# Patient Record
Sex: Male | Born: 1960 | Race: Black or African American | Hispanic: No | Marital: Married | Smoking: Never smoker
Health system: Southern US, Community
[De-identification: ages and names within clinical notes are randomized; demographics above are authoritative.]

## PROBLEM LIST (undated history)

## (undated) DIAGNOSIS — I10 Essential (primary) hypertension: Secondary | ICD-10-CM

## (undated) DIAGNOSIS — E7800 Pure hypercholesterolemia, unspecified: Secondary | ICD-10-CM

## (undated) DIAGNOSIS — E119 Type 2 diabetes mellitus without complications: Secondary | ICD-10-CM

## (undated) HISTORY — DX: Essential (primary) hypertension: I10

## (undated) HISTORY — PX: SHOULDER ARTHROTOMY: SHX1050

## (undated) HISTORY — DX: Pure hypercholesterolemia, unspecified: E78.00

## (undated) HISTORY — DX: Type 2 diabetes mellitus without complications: E11.9

---

## 2005-03-01 ENCOUNTER — Emergency Department (HOSPITAL_COMMUNITY): Admission: EM | Admit: 2005-03-01 | Discharge: 2005-03-01 | Payer: Self-pay | Admitting: Emergency Medicine

## 2005-11-03 ENCOUNTER — Emergency Department (HOSPITAL_COMMUNITY): Admission: EM | Admit: 2005-11-03 | Discharge: 2005-11-03 | Payer: Self-pay | Admitting: Emergency Medicine

## 2007-04-07 ENCOUNTER — Emergency Department (HOSPITAL_COMMUNITY): Admission: EM | Admit: 2007-04-07 | Discharge: 2007-04-08 | Payer: Self-pay | Admitting: Emergency Medicine

## 2009-10-04 ENCOUNTER — Emergency Department (HOSPITAL_COMMUNITY): Admission: EM | Admit: 2009-10-04 | Discharge: 2009-10-04 | Payer: Self-pay | Admitting: Emergency Medicine

## 2010-07-28 ENCOUNTER — Emergency Department (HOSPITAL_COMMUNITY)
Admission: EM | Admit: 2010-07-28 | Discharge: 2010-07-28 | Payer: Self-pay | Source: Home / Self Care | Admitting: Emergency Medicine

## 2011-05-03 ENCOUNTER — Emergency Department (HOSPITAL_COMMUNITY)
Admission: EM | Admit: 2011-05-03 | Discharge: 2011-05-03 | Disposition: A | Discharge status: Home / Self Care | Payer: 59 | Attending: Emergency Medicine | Admitting: Emergency Medicine

## 2011-05-03 DIAGNOSIS — K0889 Other specified disorders of teeth and supporting structures: Secondary | ICD-10-CM

## 2011-05-03 DIAGNOSIS — K089 Disorder of teeth and supporting structures, unspecified: Secondary | ICD-10-CM | POA: Insufficient documentation

## 2011-05-03 DIAGNOSIS — N390 Urinary tract infection, site not specified: Secondary | ICD-10-CM | POA: Insufficient documentation

## 2011-05-03 LAB — URINE MICROSCOPIC-ADD ON

## 2011-05-03 LAB — URINALYSIS, ROUTINE W REFLEX MICROSCOPIC
Ketones, ur: NEGATIVE mg/dL
Leukocytes, UA: NEGATIVE
Nitrite: NEGATIVE
Protein, ur: NEGATIVE mg/dL
pH: 6 (ref 5.0–8.0)

## 2011-05-03 MED ORDER — HYDROCODONE-ACETAMINOPHEN 5-325 MG PO TABS
1.0000 | ORAL_TABLET | Freq: Once | ORAL | Status: AC
  Administered 2011-05-03: 1 via ORAL
  Filled 2011-05-03: qty 1

## 2011-05-03 MED ORDER — CEPHALEXIN 500 MG PO CAPS
500.0000 mg | ORAL_CAPSULE | Freq: Once | ORAL | Status: AC
  Administered 2011-05-03: 500 mg via ORAL
  Filled 2011-05-03: qty 1

## 2011-05-03 MED ORDER — CEPHALEXIN 500 MG PO CAPS: 500.0000 mg | ORAL_CAPSULE | Freq: Four times a day (QID) | ORAL | Status: AC

## 2011-05-03 MED ORDER — HYDROCODONE-ACETAMINOPHEN 5-325 MG PO TABS: ORAL_TABLET | ORAL | Status: AC

## 2011-05-03 NOTE — ED Notes (Signed)
Also wants to be checked for right lower toothache.

## 2011-05-03 NOTE — ED Notes (Signed)
Flu like symptoms since friday

## 2011-05-03 NOTE — ED Provider Notes (Signed)
History     CSN: [id number] Arrival date & time: 05/03/2011  9:06 PM   First MD Initiated Contact with Patient 05/03/11 2100      Chief Complaint  Patient presents with  . Influenza    flu like s/s since friday  . Dental Pain    (Consider location/radiation/quality/duration/timing/severity/associated sxs/prior treatment) HPI Comments: Patient c/o urinary frequency , back pain, fever and chills for two days.  Patient is concerned he may have the "flu".  Denies cough, congestion, chest pain or Shortness of breath.  He also c/o painful right lower tooth.    Patient is a 50 y.o. male presenting with dysuria. The history is provided by the patient.  Dysuria  This is a new problem. The current episode started 2 days ago. The problem occurs every urination. The problem has not changed since onset.The quality of the pain is described as burning. The pain is at a severity of 6/10. The pain is mild. The maximum temperature recorded prior to his arrival was 100 to 100.9 F. He is sexually active. There is no history of pyelonephritis. Associated symptoms include chills and frequency. Pertinent negatives include no nausea, no vomiting, no discharge, no hematuria, no hesitancy, no urgency and no flank pain. Associated symptoms comments: Body aches. He has tried nothing for the symptoms. His past medical history does not include kidney stones, single kidney, urological procedure or recurrent UTIs.    History reviewed. No pertinent past medical history.  History reviewed. No pertinent past surgical history.  History reviewed. No pertinent family history.  History  Substance Use Topics  . Smoking status: Never Smoker   . Smokeless tobacco: Not on file  . Alcohol Use: No      Review of Systems  Constitutional: Positive for fever and chills. Negative for fatigue.  HENT: Positive for dental problem. Negative for ear pain, congestion, sore throat, facial swelling, rhinorrhea, trouble swallowing,  neck pain, neck stiffness and sinus pressure.   Respiratory: Negative for cough, shortness of breath and wheezing.   Cardiovascular: Negative for chest pain and palpitations.  Gastrointestinal: Negative for nausea, vomiting, abdominal pain, diarrhea and blood in stool.  Genitourinary: Positive for dysuria and frequency. Negative for hesitancy, urgency, hematuria, flank pain, decreased urine volume, discharge, penile swelling, scrotal swelling, genital sores, penile pain and testicular pain.  Musculoskeletal: Positive for myalgias and back pain. Negative for arthralgias.  Skin: Negative.  Negative for rash.  Neurological: Negative for dizziness, weakness and numbness.  Hematological: Negative for adenopathy. Does not bruise/bleed easily.  All other systems reviewed and are negative.    Allergies  Review of patient's allergies indicates not on file.  Home Medications  No current outpatient prescriptions on file.  BP 139/77  Pulse 105  Temp(Src) 99 F (37.2 C) (Oral)  Resp 16  Ht 5\' 8"  (1.727 m)  Wt 222 lb (100.699 kg)  BMI 33.76 kg/m2  SpO2 100%  Physical Exam  Nursing note and vitals reviewed. Constitutional: He is oriented to person, place, and time. He appears well-developed and well-nourished. No distress.  HENT:  Head: Normocephalic and atraumatic.  Mouth/Throat: Oropharynx is clear and moist.  Neck: Normal range of motion. Neck supple.  Cardiovascular: Normal rate, regular rhythm and normal heart sounds.   Pulmonary/Chest: Effort normal and breath sounds normal. No respiratory distress. He exhibits no tenderness.  Abdominal: Soft. He exhibits no distension and no mass. There is no tenderness. There is no rebound and no guarding.  Musculoskeletal: Normal range of motion. He  exhibits tenderness.       Lumbar back: He exhibits tenderness. He exhibits normal range of motion, no bony tenderness, no swelling, no spasm and normal pulse.       Diffuse ttp lower lumbar paraspinal  muscles.  No focal neuro deficits , DTR's nml  Lymphadenopathy:    He has no cervical adenopathy.  Neurological: He is alert and oriented to person, place, and time. He has normal reflexes. No cranial nerve deficit. He exhibits normal muscle tone. Coordination normal.  Skin: Skin is warm and dry.    ED Course  Procedures (including critical care time)  Results for orders placed during the hospital encounter of 05/03/11  URINALYSIS, ROUTINE W REFLEX MICROSCOPIC      Component Value Range   Color, Urine YELLOW  YELLOW    Appearance CLEAR  CLEAR    Specific Gravity, Urine 1.020  1.005 - 1.030    pH 6.0  5.0 - 8.0    Glucose, UA NEGATIVE  NEGATIVE (mg/dL)   Hgb urine dipstick LARGE (*) NEGATIVE    Bilirubin Urine NEGATIVE  NEGATIVE    Ketones, ur NEGATIVE  NEGATIVE (mg/dL)   Protein, ur NEGATIVE  NEGATIVE (mg/dL)   Urobilinogen, UA 0.2  0.0 - 1.0 (mg/dL)   Nitrite NEGATIVE  NEGATIVE    Leukocytes, UA NEGATIVE  NEGATIVE   URINE MICROSCOPIC-ADD ON      Component Value Range   WBC, UA 3-6  <3 (WBC/hpf)   RBC / HPF 7-10  <3 (RBC/hpf)   Bacteria, UA RARE  RARE          MDM    9:49 PM urine culture pending.  Patient is alert, non-toxic appearing.  Vitals stable.  No CVA tenderness, significant fever or vomiting to suggest pyleo.     OUTPATIENT MEDICATIONS PRESCRIBED FROM THE ED:   New Prescriptions   CEPHALEXIN (KEFLEX) 500 MG CAPSULE    Take 1 capsule (500 mg total) by mouth 4 (four) times daily. For 7 days   HYDROCODONE-ACETAMINOPHEN (NORCO) 5-325 MG PER TABLET    Take one or two tabs po q 4-6 hrs prn pain      The patient appears reasonably screened and/or stabilized for discharge and I doubt any other medical condition or other [facility] requiring further screening, evaluation, or treatment in the ED at this time prior to discharge.    Adrijana Haros L. Nieves Barberi, PA 05/08/11 1400

## 2011-05-05 LAB — URINE CULTURE
Colony Count: NO GROWTH
Culture: NO GROWTH

## 2011-05-12 NOTE — ED Provider Notes (Signed)
Medical screening examination/treatment/procedure(s) were performed by non-physician practitioner and as supervising physician I was immediately available for consultation/collaboration.  Donnetta Hutching, MD 05/12/11 [phone]

## 2011-12-10 ENCOUNTER — Emergency Department (HOSPITAL_COMMUNITY)
Admission: EM | Admit: 2011-12-10 | Discharge: 2011-12-11 | Disposition: A | Discharge status: Home / Self Care | Payer: BC Managed Care – PPO | Attending: Emergency Medicine | Admitting: Emergency Medicine

## 2011-12-10 ENCOUNTER — Encounter (HOSPITAL_COMMUNITY): Payer: Self-pay | Admitting: *Deleted

## 2011-12-10 DIAGNOSIS — S335XXA Sprain of ligaments of lumbar spine, initial encounter: Secondary | ICD-10-CM | POA: Insufficient documentation

## 2011-12-10 DIAGNOSIS — X500XXA Overexertion from strenuous movement or load, initial encounter: Secondary | ICD-10-CM | POA: Insufficient documentation

## 2011-12-10 DIAGNOSIS — S39012A Strain of muscle, fascia and tendon of lower back, initial encounter: Secondary | ICD-10-CM

## 2011-12-10 NOTE — ED Notes (Signed)
Lower back pain started yesterday, denies injury

## 2011-12-11 MED ORDER — PREDNISONE 50 MG PO TABS: ORAL_TABLET | ORAL | Status: AC

## 2011-12-11 MED ORDER — CYCLOBENZAPRINE HCL 10 MG PO TABS
10.0000 mg | ORAL_TABLET | Freq: Once | ORAL | Status: AC
  Administered 2011-12-11: 10 mg via ORAL
  Filled 2011-12-11: qty 1

## 2011-12-11 MED ORDER — PREDNISONE 20 MG PO TABS
60.0000 mg | ORAL_TABLET | Freq: Once | ORAL | Status: AC
  Administered 2011-12-11: 60 mg via ORAL
  Filled 2011-12-11: qty 3

## 2011-12-11 MED ORDER — CYCLOBENZAPRINE HCL 10 MG PO TABS: ORAL_TABLET | ORAL | Status: DC

## 2011-12-11 NOTE — ED Notes (Signed)
Discharge instructions reviewed with pt, questions answered. Pt verbalized understanding.  

## 2011-12-11 NOTE — ED Provider Notes (Signed)
Medical screening examination/treatment/procedure(s) were performed by non-physician practitioner and as supervising physician I was immediately available for consultation/collaboration.   Latronda Spink L Jethro Radke, MD 12/11/11 0339 

## 2011-12-11 NOTE — Discharge Instructions (Signed)
Back Pain, Adult Low back pain is very common. About 1 in 5 people have back pain.The cause of low back pain is rarely dangerous. The pain often gets better over time.About half of people with a sudden onset of back pain feel better in just 2 weeks. About 8 in 10 people feel better by 6 weeks.  CAUSES Some common causes of back pain include:  Strain of the muscles or ligaments supporting the spine.   Wear and tear (degeneration) of the spinal discs.   Arthritis.   Direct injury to the back.  DIAGNOSIS Most of the time, the direct cause of low back pain is not known.However, back pain can be treated effectively even when the exact cause of the pain is unknown.Answering your caregiver's questions about your overall health and symptoms is one of the most accurate ways to make sure the cause of your pain is not dangerous. If your caregiver needs more information, he or she may order lab work or imaging tests (X-rays or MRIs).However, even if imaging tests show changes in your back, this usually does not require surgery. HOME CARE INSTRUCTIONS For many people, back pain returns.Since low back pain is rarely dangerous, it is often a condition that people can learn to manageon their own.   Remain active. It is stressful on the back to sit or stand in one place. Do not sit, drive, or stand in one place for more than 30 minutes at a time. Take short walks on level surfaces as soon as pain allows.Try to increase the length of time you walk each day.   Do not stay in bed.Resting more than 1 or 2 days can delay your recovery.   Do not avoid exercise or work.Your body is made to move.It is not dangerous to be active, even though your back may hurt.Your back will likely heal faster if you return to being active before your pain is gone.   Pay attention to your body when you bend and lift. Many people have less discomfortwhen lifting if they bend their knees, keep the load close to their  bodies,and avoid twisting. Often, the most comfortable positions are those that put less stress on your recovering back.   Find a comfortable position to sleep. Use a firm mattress and lie on your side with your knees slightly bent. If you lie on your back, put a pillow under your knees.   Only take over-the-counter or prescription medicines as directed by your caregiver. Over-the-counter medicines to reduce pain and inflammation are often the most helpful.Your caregiver may prescribe muscle relaxant drugs.These medicines help dull your pain so you can more quickly return to your normal activities and healthy exercise.   Put ice on the injured area.   Put ice in a plastic bag.   Place a towel between your skin and the bag.   Leave the ice on for 15 to 20 minutes, 3 to 4 times a day for the first 2 to 3 days. After that, ice and heat may be alternated to reduce pain and spasms.   Ask your caregiver about trying back exercises and gentle massage. This may be of some benefit.   Avoid feeling anxious or stressed.Stress increases muscle tension and can worsen back pain.It is important to recognize when you are anxious or stressed and learn ways to manage it.Exercise is a great option.  SEEK MEDICAL CARE IF:  You have pain that is not relieved with rest or medicine.   You have   pain that does not improve in 1 week.   You have new symptoms.   You are generally not feeling well.  SEEK IMMEDIATE MEDICAL CARE IF:   You have pain that radiates from your back into your legs.   You develop new bowel or bladder control problems.   You have unusual weakness or numbness in your arms or legs.   You develop nausea or vomiting.   You develop abdominal pain.   You feel faint.  Document Released: 06/29/2005 Document Revised: 06/18/2011 Document Reviewed: 11/17/2010 [facility] Patient Information 2012 [place], [place].   Take the meds as directed.  Apply ice several times daily.  Follow up  with dr. Hilda Lias as needed.

## 2011-12-11 NOTE — ED Provider Notes (Signed)
History     CSN: [id number]  Arrival date & time 12/10/11  2200   First MD Initiated Contact with Patient 12/10/11 2335      Chief Complaint  Patient presents with  . 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  Back was sore yest but much worse upon awakening this AM.  No radiation.  Patient is a 51 y.o. male presenting with back pain. The history is provided by the patient. No language interpreter was used.  Back Pain  This is a new problem. The current episode started yesterday. The problem occurs constantly. The problem has been gradually worsening. The pain is associated with lifting heavy objects. The pain is present in the lumbar spine. The quality of the pain is described as aching. The pain does not radiate. The pain is severe. The symptoms are aggravated by bending, certain positions and twisting. The pain is the same all the time. Pertinent negatives include no fever, no numbness, no dysuria and no weakness. Treatments tried: aspirin. The treatment provided no (hurt stomach) relief.    History reviewed. No pertinent past medical history.  History reviewed. No pertinent past surgical history.  History reviewed. No pertinent family history.  History  Substance Use Topics  . Smoking status: Never Smoker   . Smokeless tobacco: Not on file  . Alcohol Use: No      Review of Systems  Constitutional: Negative for fever.  Genitourinary: Negative for dysuria, urgency, frequency, hematuria and flank pain.  Musculoskeletal: Positive for back pain.  Neurological: Negative for weakness and numbness.  All other systems reviewed and are negative.    Allergies  Review of patient's allergies indicates no known allergies.  Home Medications   Current Outpatient Rx  Name Route Sig Dispense Refill    . CYCLOBENZAPRINE HCL 10 MG PO TABS  1/2 to one tab po TID prn low back pain 20 tablet 0  . PREDNISONE 50 MG PO TABS  One tab po QD 6 tablet 0    BP 142/88  Pulse 72  Temp(Src) 98.2 F (36.8 C) (Oral)  Resp 16  Ht 5\' 8"  (1.727 m)  Wt 220 lb (99.791 kg)  BMI 33.45 kg/m2  SpO2 99%  Physical Exam  Nursing note and vitals reviewed. Constitutional: He is oriented to person, place, and time. He appears well-developed and well-nourished.  HENT:  Head: Normocephalic and atraumatic.  Eyes: EOM are normal.  Neck: Normal range of motion.  Cardiovascular: Normal rate, regular rhythm, normal heart sounds and intact distal pulses.   Pulmonary/Chest: Effort normal and breath sounds normal. No respiratory distress.  Abdominal: Soft. He exhibits no distension. There is no tenderness.  Musculoskeletal: He exhibits tenderness.       Arms: Neurological: He is alert and oriented to person, place, and time. He has normal strength. No sensory deficit. Gait abnormal. Coordination normal. GCS eye subscore is 4. GCS verbal subscore is 5. GCS motor subscore is 6.  Reflex Scores:      Patellar reflexes are 2+ on the right side and 2+ on the left side.      Achilles reflexes are 2+ on the right side and 2+ on the left side.      Pain with movement and walking.  Skin: Skin is warm and dry.  Psychiatric: He has a normal mood and affect. Judgment normal.  ED Course  Procedures (including critical care time)  Labs Reviewed - No data to display No results found.   1. Lumbar strain       MDM  No saddle dysthesia.   rx- prednisone 50 mg , 6 rx-flexeril 10 mg, TID, 21. [address] [place], [place] 12/11/11 [phone]

## 2012-10-11 ENCOUNTER — Telehealth: Payer: Self-pay

## 2012-10-11 ENCOUNTER — Other Ambulatory Visit: Payer: Self-pay

## 2012-10-11 DIAGNOSIS — Z1211 Encounter for screening for malignant neoplasm of colon: Secondary | ICD-10-CM

## 2012-10-12 NOTE — Telephone Encounter (Signed)
   Gastroenterology Pre-Procedure Form   Request Date: 10/11/2012     Requesting Physician: Dr. Phillips Odor     PATIENT INFORMATION:  Thomas Johnson is a 52 y.o., male (DOB=[birth year]).  PROCEDURE: Procedure(s) requested: colonoscopy Procedure Reason: screening for colon cancer  PATIENT REVIEW QUESTIONS: The patient reports the following:   1. Diabetes Melitis: no 2. Joint replacements in the past 12 months: no 3. Major health problems in the past 3 months: no 4. Has an artificial valve or MVP:no 5. Has been advised in past to take antibiotics in advance of a procedure like teeth cleaning: no}    MEDICATIONS & ALLERGIES:    Patient reports the following regarding taking any blood thinners:   Plavix? no Aspirin?no Coumadin?  no  Patient confirms/reports the following medications:  Current Outpatient Prescriptions  Medication Sig Dispense Refill  . terbinafine (LAMISIL) 250 MG tablet Take 250 mg by mouth daily.       No current facility-administered medications for this visit.    Patient confirms/reports the following allergies:  No Known Allergies  Patient is appropriate to schedule for requested procedure(s): yes  AUTHORIZATION INFORMATION Primary Insurance:   ID #:   Group #:  Pre-Cert / Auth required: Pre-Cert / Auth #:   Secondary Insurance:   ID #:   Group #:  Pre-Cert / Auth required:  Pre-Cert / Auth #:   No orders of the defined types were placed in this encounter.    SCHEDULE INFORMATION: Procedure has been scheduled as follows:  Date: 11/04/2012    Time: 9:30 AM  Location: [facility] Short Stay  This Gastroenterology Pre-Precedure Form is being routed to the following provider(s) for review: R. Roetta Sessions, MD

## 2012-10-12 NOTE — Telephone Encounter (Signed)
Ok to schedule.

## 2012-10-13 MED ORDER — PEG-KCL-NACL-NASULF-NA ASC-C 100 G PO SOLR: 1.0000 | ORAL | Status: DC

## 2012-10-13 NOTE — Telephone Encounter (Signed)
Rx sent to [facility] in [place]. Instructions mailed to pt.

## 2012-11-04 ENCOUNTER — Ambulatory Visit (HOSPITAL_COMMUNITY)
Admission: RE | Admit: 2012-11-04 | Discharge: 2012-11-04 | Disposition: A | Discharge status: Home / Self Care | Payer: BC Managed Care – PPO | Source: Ambulatory Visit | Attending: Internal Medicine | Admitting: Internal Medicine

## 2012-11-04 ENCOUNTER — Encounter (HOSPITAL_COMMUNITY)
Admission: RE | Disposition: A | Discharge status: Home / Self Care | Payer: Self-pay | Source: Ambulatory Visit | Attending: Internal Medicine

## 2012-11-04 ENCOUNTER — Encounter (HOSPITAL_COMMUNITY): Payer: Self-pay | Admitting: *Deleted

## 2012-11-04 DIAGNOSIS — Z1211 Encounter for screening for malignant neoplasm of colon: Secondary | ICD-10-CM | POA: Insufficient documentation

## 2012-11-04 HISTORY — PX: COLONOSCOPY: SHX5424

## 2012-11-04 SURGERY — COLONOSCOPY
Anesthesia: Moderate Sedation

## 2012-11-04 MED ORDER — MIDAZOLAM HCL 5 MG/5ML IJ SOLN
INTRAMUSCULAR | Status: DC | PRN
  Administered 2012-11-04 (×2): 2 mg via INTRAVENOUS

## 2012-11-04 MED ORDER — MIDAZOLAM HCL 5 MG/5ML IJ SOLN
INTRAMUSCULAR | Status: AC
  Filled 2012-11-04: qty 10

## 2012-11-04 MED ORDER — ONDANSETRON HCL 4 MG/2ML IJ SOLN
INTRAMUSCULAR | Status: AC
  Filled 2012-11-04: qty 2

## 2012-11-04 MED ORDER — MEPERIDINE HCL 100 MG/ML IJ SOLN
INTRAMUSCULAR | Status: AC
  Filled 2012-11-04: qty 2

## 2012-11-04 MED ORDER — ONDANSETRON HCL 4 MG/2ML IJ SOLN
INTRAMUSCULAR | Status: DC | PRN
  Administered 2012-11-04: 4 mg via INTRAVENOUS

## 2012-11-04 MED ORDER — STERILE WATER FOR IRRIGATION IR SOLN
Status: DC | PRN
  Administered 2012-11-04: 09:00:00

## 2012-11-04 MED ORDER — MEPERIDINE HCL 100 MG/ML IJ SOLN
INTRAMUSCULAR | Status: DC | PRN
  Administered 2012-11-04 (×2): 50 mg via INTRAVENOUS

## 2012-11-04 MED ORDER — SODIUM CHLORIDE 0.9 % IV SOLN
INTRAVENOUS | Status: DC
  Administered 2012-11-04: 1000 mL via INTRAVENOUS

## 2012-11-04 NOTE — Op Note (Signed)
[facility] [address] [place], [address]   COLONOSCOPY PROCEDURE REPORT  PATIENT: Thomas Johnson, Thomas Johnson  MR#:         [id number] BIRTHDATE: [birth year] , 52  yrs. old GENDER: Male ENDOSCOPIST: R.  Roetta Sessions, MD [facility] REFERRED BY:  Lenise Herald, PA-C PROCEDURE DATE:  11/04/2012 PROCEDURE:     Screening colonoscopy  INDICATIONS: First-ever average risk screening examination  INFORMED CONSENT:  The risks, benefits, alternatives and imponderables including but not limited to bleeding, perforation as well as the possibility of a missed lesion have been reviewed.  The potential for biopsy, lesion removal, etc. have also been discussed.  Questions have been answered.  All parties agreeable. Please see the history and physical in the medical record for more information.  MEDICATIONS: Versed 4 mg IV and Demerol 100 mg IV in divided doses. Zofran 4 mg  DESCRIPTION OF PROCEDURE:  After a digital rectal exam was performed, the EC-3890Li ([id number])  colonoscope was advanced from the anus through the rectum and colon to the area of the cecum, ileocecal valve and appendiceal orifice.  The cecum was deeply intubated.  These structures were well-seen and photographed for the record.  From the level of the cecum and ileocecal valve, the scope was slowly and cautiously withdrawn.  The mucosal surfaces were carefully surveyed utilizing scope tip deflection to facilitate fold flattening as needed.  The scope was pulled down into the rectum where a thorough examination including retroflexion was performed.    FINDINGS:  Adequate preparation. The rectal and colonic mucosa appeared normal.   THERAPEUTIC / DIAGNOSTIC MANEUVERS PERFORMED:  None  COMPLICATIONS: none  CECAL WITHDRAWAL TIME:  8 minutes  IMPRESSION:  Normal colonoscopy  RECOMMENDATIONS: Repeat screening examination in 10 years   _______________________________ eSigned:  R. Roetta Sessions, MD FACP  [facility] 11/04/2012 9:39 AM   CC:

## 2012-11-04 NOTE — H&P (Signed)
  Primary Care Physician:  Lenise Herald, PA-C Primary Gastroenterologist:  Dr. Jena Gauss  Pre-Procedure History & Physical: HPI:  Thomas Johnson is a 52 y.o. male is here for a screening colonoscopy. No bowel symptoms. No family history colon polyps or colon cancer. No prior colonoscopy.  History reviewed. No pertinent past medical history.  Past Surgical History  Procedure Laterality Date  . Shoulder arthrotomy      removal of cyst    Prior to Admission medications   Medication Sig Start Date End Date Taking? Authorizing Provider  terbinafine (LAMISIL) 250 MG tablet Take 250 mg by mouth daily.   Yes Historical Provider, MD  peg 3350 powder (MOVIPREP) 100 G SOLR Take 1 kit (100 g total) by mouth as directed. 10/13/12   Corbin Ade, MD    Allergies as of 10/11/2012  . (No Known Allergies)    History reviewed. No pertinent family history.  History   Social History  . Marital Status: Married    Spouse Name: N/A    Number of Children: N/A  . Years of Education: N/A   Occupational History  . Not on file.   Social History Main Topics  . Smoking status: Never Smoker   . Smokeless tobacco: Not on file  . Alcohol Use: No  . Drug Use: No  . Sexually Active: Yes    Birth Control/ Protection: None   Other Topics Concern  . Not on file   Social History Narrative  . No narrative on file    Review of Systems: See HPI, otherwise negative ROS  Physical Exam: BP 158/83  Pulse 82  Temp(Src) 98.7 F (37.1 C) (Oral)  Resp 27  SpO2 93% General:   Alert,  Well-developed, well-nourished, pleasant and cooperative in NAD Head:  Normocephalic and atraumatic. Eyes:  Sclera clear, no icterus.   Conjunctiva pink. Ears:  Normal auditory acuity. Nose:  No deformity, discharge,  or lesions. Mouth:  No deformity or lesions, dentition normal. Neck:  Supple; no masses or thyromegaly. Lungs:  Clear throughout to auscultation.   No wheezes, crackles, or rhonchi. No acute  distress. Heart:  Regular rate and rhythm; no murmurs, clicks, rubs,  or gallops. Abdomen:  Soft, nontender and nondistended. No masses, hepatosplenomegaly or hernias noted. Normal bowel sounds, without guarding, and without rebound.   Msk:  Symmetrical without gross deformities. Normal posture. Pulses:  Normal pulses noted. Extremities:  Without clubbing or edema. Neurologic:  Alert and  oriented x4;  grossly normal neurologically. Skin:  Intact without significant lesions or rashes. Cervical Nodes:  No significant cervical adenopathy. Psych:  Alert and cooperative. Normal mood and affect.  Impression/Plan: Thomas Johnson is now here to undergo a screening colonoscopy.  First-ever average risk screening examination to  Risks, benefits, limitations, imponderables and alternatives regarding colonoscopy have been reviewed with the patient. Questions have been answered. All parties agreeable.

## 2012-11-08 ENCOUNTER — Encounter (HOSPITAL_COMMUNITY): Payer: Self-pay | Admitting: Internal Medicine

## 2013-01-31 ENCOUNTER — Encounter (HOSPITAL_COMMUNITY): Payer: Self-pay | Admitting: Emergency Medicine

## 2013-01-31 ENCOUNTER — Emergency Department (HOSPITAL_COMMUNITY): Payer: BC Managed Care – PPO

## 2013-01-31 ENCOUNTER — Emergency Department (HOSPITAL_COMMUNITY)
Admission: EM | Admit: 2013-01-31 | Discharge: 2013-02-01 | Disposition: A | Discharge status: Home / Self Care | Payer: BC Managed Care – PPO | Attending: Emergency Medicine | Admitting: Emergency Medicine

## 2013-01-31 DIAGNOSIS — M545 Low back pain, unspecified: Secondary | ICD-10-CM | POA: Insufficient documentation

## 2013-01-31 DIAGNOSIS — M549 Dorsalgia, unspecified: Secondary | ICD-10-CM

## 2013-01-31 DIAGNOSIS — Z791 Long term (current) use of non-steroidal anti-inflammatories (NSAID): Secondary | ICD-10-CM | POA: Insufficient documentation

## 2013-01-31 DIAGNOSIS — M25511 Pain in right shoulder: Secondary | ICD-10-CM

## 2013-01-31 DIAGNOSIS — Z79899 Other long term (current) drug therapy: Secondary | ICD-10-CM | POA: Insufficient documentation

## 2013-01-31 DIAGNOSIS — M2550 Pain in unspecified joint: Secondary | ICD-10-CM | POA: Insufficient documentation

## 2013-01-31 DIAGNOSIS — M25519 Pain in unspecified shoulder: Secondary | ICD-10-CM | POA: Insufficient documentation

## 2013-01-31 MED ORDER — CYCLOBENZAPRINE HCL 10 MG PO TABS
10.0000 mg | ORAL_TABLET | Freq: Once | ORAL | Status: AC
  Administered 2013-02-01: 10 mg via ORAL
  Filled 2013-01-31: qty 1

## 2013-01-31 MED ORDER — CYCLOBENZAPRINE HCL 10 MG PO TABS: 10.0000 mg | ORAL_TABLET | Freq: Three times a day (TID) | ORAL | Status: AC | PRN

## 2013-01-31 MED ORDER — NAPROXEN 500 MG PO TABS: 500.0000 mg | ORAL_TABLET | Freq: Two times a day (BID) | ORAL | Status: AC

## 2013-01-31 MED ORDER — HYDROCODONE-ACETAMINOPHEN 5-325 MG PO TABS: ORAL_TABLET | ORAL | Status: AC

## 2013-01-31 MED ORDER — OXYCODONE-ACETAMINOPHEN 5-325 MG PO TABS
1.0000 | ORAL_TABLET | Freq: Once | ORAL | Status: AC
  Administered 2013-02-01: 1 via ORAL
  Filled 2013-01-31: qty 1

## 2013-01-31 NOTE — ED Notes (Signed)
Patient complaining of lower right back pain that started 2 days ago. Also complaining of right shoulder pain that started today, denies injury to shoulder.

## 2013-01-31 NOTE — ED Notes (Signed)
Patient reports back hurts only when moving or changing positions. States shoulder pain is constant.

## 2013-02-03 NOTE — ED Provider Notes (Signed)
CSN: [id number]     Arrival date & time 01/31/13  2115 History     First MD Initiated Contact with Patient 01/31/13 2154     Chief Complaint  Patient presents with  . Back Pain  . Shoulder Pain   (Consider location/radiation/quality/duration/timing/severity/associated sxs/prior Treatment) Patient is a 52 y.o. male presenting with back pain and shoulder pain. The history is provided by the patient.  Back Pain Location:  Lumbar spine, gluteal region and sacro-iliac joint Quality:  Aching Radiates to:  R posterior upper leg Pain severity:  Moderate Pain is:  Same all the time Onset quality:  Gradual Timing:  Constant Progression:  Unchanged Chronicity:  Recurrent Context: not falling, not lifting heavy objects and not recent injury   Relieved by:  Nothing Worsened by:  Bending, twisting, standing and movement Ineffective treatments:  OTC medications Associated symptoms: leg pain   Associated symptoms: no abdominal pain, no abdominal swelling, no bladder incontinence, no bowel incontinence, no chest pain, no dysuria, no fever, no headaches, no numbness, no paresthesias, no pelvic pain, no perianal numbness, no tingling and no weakness   Shoulder Pain This is a new problem. The current episode started 1 to 4 weeks ago. The problem occurs intermittently. The problem has been unchanged. Associated symptoms include arthralgias. Pertinent negatives include no abdominal pain, chest pain, congestion, fever, headaches, joint swelling, neck pain, numbness, rash, sore throat, vomiting or weakness. The symptoms are aggravated by bending. He has tried acetaminophen for the symptoms. The treatment provided no relief.    History reviewed. No pertinent past medical history. Past Surgical History  Procedure Laterality Date  . Shoulder arthrotomy      removal of cyst  . Colonoscopy N/A 11/04/2012    Procedure: COLONOSCOPY;  Surgeon: Corbin Ade, MD;  Location: [facility];  Service: Endoscopy;   Laterality: N/A;  9:30 AM   History reviewed. No pertinent family history. History  Substance Use Topics  . Smoking status: Never Smoker   . Smokeless tobacco: Not on file  . Alcohol Use: No    Review of Systems  Constitutional: Negative for fever.  HENT: Negative for congestion, sore throat and neck pain.   Respiratory: Negative for shortness of breath.   Cardiovascular: Negative for chest pain.  Gastrointestinal: Negative for vomiting, abdominal pain, constipation and bowel incontinence.  Genitourinary: Negative for bladder incontinence, dysuria, hematuria, flank pain, decreased urine volume, difficulty urinating and pelvic pain.       No perineal numbness or incontinence of urine or feces  Musculoskeletal: Positive for back pain and arthralgias. Negative for joint swelling.  Skin: Negative for rash.  Neurological: Negative for tingling, weakness, numbness, headaches and paresthesias.  All other systems reviewed and are negative.    Allergies  Review of patient's allergies indicates no known allergies.  Home Medications   Current Outpatient Rx  Name  Route  Sig  Dispense  Refill  . acetaminophen (TYLENOL) 500 MG tablet   Oral   Take 500 mg by mouth daily as needed for pain.         Marland Kitchen BIOTIN PO   Oral   Take 1 tablet by mouth daily.         . Multiple Vitamin (MULTIVITAMIN WITH MINERALS) TABS   Oral   Take 1 tablet by mouth every morning.         . terbinafine (LAMISIL) 250 MG tablet   Oral   Take 250 mg by mouth daily.         Marland Kitchen  cyclobenzaprine (FLEXERIL) 10 MG tablet   Oral   Take 1 tablet (10 mg total) by mouth 3 (three) times daily as needed.   21 tablet   0   . HYDROcodone-acetaminophen (NORCO/VICODIN) 5-325 MG per tablet      Take one-two tabs po q 4-6 hrs prn pain   20 tablet   0   . naproxen (NAPROSYN) 500 MG tablet   Oral   Take 1 tablet (500 mg total) by mouth 2 (two) times daily with a meal.   20 tablet   0    BP 135/81  Pulse 67   Temp(Src) 98.3 F (36.8 C) (Oral)  Resp 20  Ht 5\' 8"  (1.727 m)  Wt 218 lb (98.884 kg)  BMI 33.15 kg/m2  SpO2 98% Physical Exam  Nursing note and vitals reviewed. Constitutional: He is oriented to person, place, and time. He appears well-developed and well-nourished. No distress.  HENT:  Head: Normocephalic and atraumatic.  Neck: Normal range of motion. Neck supple.  Cardiovascular: Normal rate, regular rhythm, normal heart sounds and intact distal pulses.   No murmur heard. Pulmonary/Chest: Effort normal and breath sounds normal. No respiratory distress.  Musculoskeletal: He exhibits tenderness. He exhibits no edema.       Right shoulder: He exhibits pain. He exhibits normal range of motion, no bony tenderness, no swelling, no effusion, no crepitus, no deformity, no laceration, normal pulse and normal strength.       Lumbar back: He exhibits tenderness and pain. He exhibits normal range of motion, no swelling, no deformity, no laceration and normal pulse.  ttp of the right lumbar paraspinal muscles and right SI joint.  No spinal tenderness.  DP pulses are brisk and symmetrical.  Distal sensation intact.  Hip Flexors/Extensors are intact  Neurological: He is alert and oriented to person, place, and time. No cranial nerve deficit or sensory deficit. He exhibits normal muscle tone. Coordination and gait normal.  Reflex Scores:      Patellar reflexes are 2+ on the right side and 2+ on the left side.      Achilles reflexes are 2+ on the right side and 2+ on the left side. Skin: Skin is warm and dry.    ED Course   Procedures (including critical care time)  Labs Reviewed - No data to display No results found.  Dg Lumbar Spine Complete  01/31/2013   *RADIOLOGY REPORT*  Clinical Data: Back pain and right shoulder pain for 2 days.  No injury.  LUMBAR SPINE - COMPLETE 4+ VIEW  Comparison: None.  Findings: Five lumbar type vertebra.  Normal alignment of the lumbar vertebrae and facet  joints.  No vertebral compression deformities. Intervertebral disc space heights are preserved. Minimal endplate hypertrophic changes.  No focal bone lesion or bone destruction.  Bone cortex and trabecular architecture appear intact.  No focal bone lesion or bone destruction.  IMPRESSION: No acute bony abnormalities demonstrated in the lumbar spine.   Original Report Authenticated By: Burman Nieves, M.D.   Dg Shoulder Right  01/31/2013   *RADIOLOGY REPORT*  Clinical Data: Right shoulder pain  RIGHT SHOULDER - 2+ VIEW  Comparison: None.  Findings: No fracture or dislocation is noted.  Underlying ribs appear normal.  Degenerative change involving right acromioclavicular joint is noted.  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  No acute abnormality seen in right shoulder.   Original Report Authenticated By: Lupita Raider.,  M.D.    1. Back pain   2.  Shoulder pain, right     MDM  Previous ed chart reviewed.     Patient has ttp of the right lumbar paraspinal muscles.  No focal neuro deficits on exam.  Ambulates with a steady gait.   Right shoulder pain is intermittent, no deformity or edema, pain to shoulder reproduced with abduction.  Doubt emergent neurological or infectious process.  Pt agrees to rest, ice, heat and close f/u with his PMD, referral info also given.  Cadance Raus L. Kadan Millstein, PA-C 02/03/13 1404  Navraj Dreibelbis L. Trisha Mangle, PA-C 02/03/13 1405

## 2013-02-06 NOTE — ED Provider Notes (Signed)
Medical screening examination/treatment/procedure(s) were performed by non-physician practitioner and as supervising physician I was immediately available for consultation/collaboration. Jerzy Roepke, MD, FACEP   Anela Bensman L Cathy Ropp, MD 02/06/13 1504 

## 2014-07-28 ENCOUNTER — Emergency Department (HOSPITAL_COMMUNITY)
Admission: EM | Admit: 2014-07-28 | Discharge: 2014-07-29 | Disposition: A | Discharge status: Home / Self Care | Payer: 59 | Attending: Emergency Medicine | Admitting: Emergency Medicine

## 2014-07-28 ENCOUNTER — Emergency Department (HOSPITAL_COMMUNITY): Payer: 59

## 2014-07-28 DIAGNOSIS — J189 Pneumonia, unspecified organism: Secondary | ICD-10-CM

## 2014-07-28 DIAGNOSIS — J159 Unspecified bacterial pneumonia: Secondary | ICD-10-CM | POA: Diagnosis not present

## 2014-07-28 DIAGNOSIS — M791 Myalgia: Secondary | ICD-10-CM | POA: Diagnosis not present

## 2014-07-28 DIAGNOSIS — R509 Fever, unspecified: Secondary | ICD-10-CM

## 2014-07-28 DIAGNOSIS — R51 Headache: Secondary | ICD-10-CM | POA: Diagnosis present

## 2014-07-28 LAB — INFLUENZA PANEL BY PCR (TYPE A & B)
H1N1 flu by pcr: NOT DETECTED
Influenza A By PCR: NEGATIVE
Influenza B By PCR: NEGATIVE

## 2014-07-28 LAB — CBC WITH DIFFERENTIAL/PLATELET
BASOS PCT: 0 % (ref 0–1)
Basophils Absolute: 0 10*3/uL (ref 0.0–0.1)
EOS PCT: 0 % (ref 0–5)
Eosinophils Absolute: 0 10*3/uL (ref 0.0–0.7)
HEMATOCRIT: 42 % (ref 39.0–52.0)
Hemoglobin: 14 g/dL (ref 13.0–17.0)
LYMPHS ABS: 1 10*3/uL (ref 0.7–4.0)
Lymphocytes Relative: 8 % — ABNORMAL LOW (ref 12–46)
MCH: 29 pg (ref 26.0–34.0)
MCHC: 33.3 g/dL (ref 30.0–36.0)
MCV: 87.1 fL (ref 78.0–100.0)
Monocytes Absolute: 1 10*3/uL (ref 0.1–1.0)
Monocytes Relative: 8 % (ref 3–12)
NEUTROS ABS: 10.6 10*3/uL — AB (ref 1.7–7.7)
Neutrophils Relative %: 84 % — ABNORMAL HIGH (ref 43–77)
Platelets: 214 10*3/uL (ref 150–400)
RBC: 4.82 MIL/uL (ref 4.22–5.81)
RDW: 13.3 % (ref 11.5–15.5)
WBC: 12.6 10*3/uL — AB (ref 4.0–10.5)

## 2014-07-28 LAB — CSF CELL COUNT WITH DIFFERENTIAL
Eosinophils, CSF: 0 % (ref 0–1)
Lymphs, CSF: 0 % — ABNORMAL LOW (ref 40–80)
Monocyte-Macrophage-Spinal Fluid: 0 % — ABNORMAL LOW (ref 15–45)
RBC Count, CSF: 0 /mm3
SEGMENTED NEUTROPHILS-CSF: 0 % (ref 0–6)
TUBE #: 4
WBC, CSF: 0 /mm3 (ref 0–5)

## 2014-07-28 LAB — COMPREHENSIVE METABOLIC PANEL
ALK PHOS: 76 U/L (ref 39–117)
ALT: 25 U/L (ref 0–53)
ANION GAP: 10 (ref 5–15)
AST: 28 U/L (ref 0–37)
Albumin: 4 g/dL (ref 3.5–5.2)
BUN: 15 mg/dL (ref 6–23)
CALCIUM: 8.5 mg/dL (ref 8.4–10.5)
CHLORIDE: 102 meq/L (ref 96–112)
CO2: 25 mmol/L (ref 19–32)
CREATININE: 1.11 mg/dL (ref 0.50–1.35)
GFR calc non Af Amer: 75 mL/min — ABNORMAL LOW (ref >90)
GFR, EST AFRICAN AMERICAN: 87 mL/min — AB (ref >90)
Glucose, Bld: 127 mg/dL — ABNORMAL HIGH (ref 70–99)
POTASSIUM: 3.3 mmol/L — AB (ref 3.5–5.1)
SODIUM: 137 mmol/L (ref 135–145)
Total Bilirubin: 1.1 mg/dL (ref 0.3–1.2)
Total Protein: 7.8 g/dL (ref 6.0–8.3)

## 2014-07-28 LAB — GRAM STAIN

## 2014-07-28 LAB — PROTEIN AND GLUCOSE, CSF
GLUCOSE CSF: 73 mg/dL (ref 43–76)
TOTAL PROTEIN, CSF: 40 mg/dL (ref 15–45)

## 2014-07-28 LAB — CBG MONITORING, ED: GLUCOSE-CAPILLARY: 110 mg/dL — AB (ref 70–99)

## 2014-07-28 LAB — RAPID STREP SCREEN (MED CTR MEBANE ONLY): STREPTOCOCCUS, GROUP A SCREEN (DIRECT): NEGATIVE

## 2014-07-28 MED ORDER — DOXYCYCLINE HYCLATE 100 MG PO CAPS: 100.0000 mg | ORAL_CAPSULE | Freq: Two times a day (BID) | ORAL | Status: AC

## 2014-07-28 MED ORDER — ONDANSETRON HCL 4 MG/2ML IJ SOLN
4.0000 mg | Freq: Once | INTRAMUSCULAR | Status: AC
  Administered 2014-07-28: 4 mg via INTRAVENOUS

## 2014-07-28 MED ORDER — SODIUM CHLORIDE 0.9 % IV BOLUS (SEPSIS)
1000.0000 mL | Freq: Once | INTRAVENOUS | Status: AC
  Administered 2014-07-28: 1000 mL via INTRAVENOUS

## 2014-07-28 MED ORDER — DEXTROSE 5 % IV SOLN
500.0000 mg | Freq: Once | INTRAVENOUS | Status: AC
  Administered 2014-07-28: 500 mg via INTRAVENOUS
  Filled 2014-07-28: qty 500

## 2014-07-28 MED ORDER — ACETAMINOPHEN 325 MG PO TABS
ORAL_TABLET | ORAL | Status: AC
  Filled 2014-07-28: qty 1

## 2014-07-28 MED ORDER — ONDANSETRON HCL 4 MG/2ML IJ SOLN
INTRAMUSCULAR | Status: AC
  Filled 2014-07-28: qty 2

## 2014-07-28 MED ORDER — ACETAMINOPHEN 325 MG PO TABS
650.0000 mg | ORAL_TABLET | Freq: Once | ORAL | Status: AC
  Administered 2014-07-28: 650 mg via ORAL

## 2014-07-28 MED ORDER — KETOROLAC TROMETHAMINE 30 MG/ML IJ SOLN
30.0000 mg | Freq: Once | INTRAMUSCULAR | Status: AC
  Administered 2014-07-28: 30 mg via INTRAVENOUS
  Filled 2014-07-28: qty 1

## 2014-07-28 MED ORDER — CEFTRIAXONE SODIUM 1 G IJ SOLR
1.0000 g | Freq: Once | INTRAMUSCULAR | Status: AC
  Administered 2014-07-28: 1 g via INTRAVENOUS
  Filled 2014-07-28: qty 10

## 2014-07-28 NOTE — ED Provider Notes (Signed)
On repeat exam the patient is awake and alert, interacting appropriate, smiling. We discussed all findings, including reassuring CSF results.  Patient continues to have mild headache.  Toradol will be provided. He will be d/c in stable condition.  Thomas Munchobert Jessenya Berdan, MD 07/28/14 [phone]

## 2014-07-28 NOTE — ED Notes (Addendum)
Pt c/o headache and chills sinceThursday and nausea this morning. Pt also states he has ran out of diabetic testing strips so he doesn't know what his blood sugar is.

## 2014-07-28 NOTE — Discharge Instructions (Signed)

## 2014-07-28 NOTE — ED Notes (Signed)
Pt stated he felt nauseous and Dr. Juleen ChinaKohut informed and orders given and carried out

## 2014-07-28 NOTE — ED Notes (Signed)
Pt assisted to semi-fowler's position and given cup of water

## 2014-07-28 NOTE — ED Provider Notes (Signed)
CSN: [id number]     Arrival date & time 07/28/14  1906 History   First MD Initiated Contact with Patient 07/28/14 1939     Chief Complaint  Patient presents with  . Headache     (Consider location/radiation/quality/duration/timing/severity/associated sxs/prior Treatment) HPI Comments: Patient complains of a 2 day history of chills, headache and fever with nausea. He is a diabetic that is diet controlled. Does not take insulin or checking his blood sugars. Denies sick contacts. Has had nausea but no vomiting. Endorses diffuse headache with diffuse myalgias and muscle aches and pains. Taking Tylenol and Motrin at home. No vomiting. No chest pain, shortness of breath, abdominal pain, runny nose, sore throat or ear pain. Denies any rashes. No recent travel. Did not receive a flu shot this season. No other chronic medical conditions.  The history is provided by the patient.    No past medical history on file. Past Surgical History  Procedure Laterality Date  . Shoulder arthrotomy      removal of cyst  . Colonoscopy N/A 11/04/2012    Procedure: COLONOSCOPY;  Surgeon: Corbin Ade, MD;  Location: [facility];  Service: Endoscopy;  Laterality: N/A;  9:30 AM   No family history on file. History  Substance Use Topics  . Smoking status: Never Smoker   . Smokeless tobacco: Not on file  . Alcohol Use: No    Review of Systems  Constitutional: Positive for fever, activity change, appetite change and fatigue.  HENT: Negative for congestion.   Eyes: Negative for visual disturbance.  Respiratory: Negative for cough, chest tightness and shortness of breath.   Cardiovascular: Negative for chest pain.  Gastrointestinal: Positive for nausea. Negative for vomiting and abdominal pain.  Genitourinary: Negative for dysuria and hematuria.  Musculoskeletal: Positive for myalgias, arthralgias, neck pain and neck stiffness. Negative for back pain.  Skin: Negative for rash.  Neurological: Positive for  headaches. Negative for weakness.  A complete 10 system review of systems was obtained and all systems are negative except as noted in the HPI and PMH.      Allergies  Review of patient's allergies indicates no known allergies.  Home Medications   Prior to Admission medications   Medication Sig Start Date End Date Taking? Authorizing Provider  cyclobenzaprine (FLEXERIL) 10 MG tablet Take 1 tablet (10 mg total) by mouth 3 (three) times daily as needed. Patient not taking: Reported on 07/28/2014 01/31/13   Tammy L. Triplett, PA-C  doxycycline (VIBRAMYCIN) 100 MG capsule Take 1 capsule (100 mg total) by mouth 2 (two) times daily. 07/28/14   Glynn Octave, MD  HYDROcodone-acetaminophen (NORCO/VICODIN) 5-325 MG per tablet Take one-two tabs po q 4-6 hrs prn pain Patient not taking: Reported on 07/28/2014 01/31/13   Tammy L. Triplett, PA-C  naproxen (NAPROSYN) 500 MG tablet Take 1 tablet (500 mg total) by mouth 2 (two) times daily with a meal. Patient not taking: Reported on 07/28/2014 01/31/13   Tammy L. Triplett, PA-C   BP 121/73 mmHg  Pulse 90  Temp(Src) 99.1 F (37.3 C) (Oral)  Resp 20  Ht  (1.727 m)  Wt 212 lb (96.163 kg)  BMI 32.24 kg/m2  SpO2 96% Physical Exam  Constitutional: He is oriented to person, place, and time. He appears well-developed and well-nourished. No distress.  Appears well nontoxic  HENT:  Head: Normocephalic and atraumatic.  Mouth/Throat: Oropharynx is clear and moist. No oropharyngeal exudate.  Eyes: Conjunctivae and EOM are normal. Pupils are equal, round, and reactive to  light.  Neck: Normal range of motion. Neck supple.  Paraspinal cervical tenderness, pain with neck movement, no true meningismus  Cardiovascular: Normal rate, regular rhythm, normal heart sounds and intact distal pulses.   No murmur heard. Pulmonary/Chest: Effort normal and breath sounds normal. No respiratory distress.  Abdominal: Soft. There is no tenderness. There is no rebound and  no guarding.  Musculoskeletal: Normal range of motion. He exhibits no edema or tenderness.  Neurological: He is alert and oriented to person, place, and time. No cranial nerve deficit. He exhibits normal muscle tone. Coordination normal.  No ataxia on finger to nose bilaterally. No pronator drift. 5/5 strength throughout. CN 2-12 intact. Negative Romberg. Equal grip strength. Sensation intact. Gait is normal.   Skin: Skin is warm.  Psychiatric: He has a normal mood and affect. His behavior is normal.  Nursing note and vitals reviewed.   ED Course  LUMBAR PUNCTURE Date/Time: 07/28/2014 9:38 PM Performed by: Glynn Octave Authorized by: Glynn Octave Consent: Verbal consent obtained. Written consent obtained. Risks and benefits: risks, benefits and alternatives were discussed Consent given by: patient Patient understanding: patient states understanding of the procedure being performed Patient consent: the patient's understanding of the procedure matches consent given Procedure consent: procedure consent matches procedure scheduled Relevant documents: relevant documents present and verified Test results: test results available and properly labeled Site marked: the operative site was marked Required items: required blood products, implants, devices, and special equipment available Patient identity confirmed: verbally with patient and provided demographic data Time out: Immediately prior to procedure a "time out" was called to verify the correct patient, procedure, equipment, support staff and site/side marked as required. Indications: evaluation for infection Anesthesia: local infiltration Local anesthetic: lidocaine 1% without epinephrine Anesthetic total: 5 ml Patient sedated: no Preparation: Patient was prepped and draped in the usual sterile fashion. Lumbar space: L4-L5 interspace Patient's position: sitting Needle gauge: 20 Needle type: spinal needle - Quincke tip Needle  length: 2.5 in Number of attempts: 2 Fluid appearance: clear Tubes of fluid: 4 Total volume: 4 ml Post-procedure: site cleaned and adhesive bandage applied Patient tolerance: Patient tolerated the procedure well with no immediate complications   (including critical care time) Labs Review Labs Reviewed  CBC WITH DIFFERENTIAL - Abnormal; Notable for the following:    WBC 12.6 (*)    Neutrophils Relative % 84 (*)    Neutro Abs 10.6 (*)    Lymphocytes Relative 8 (*)    All other components within normal limits  COMPREHENSIVE METABOLIC PANEL - Abnormal; Notable for the following:    Potassium 3.3 (*)    Glucose, Bld 127 (*)    GFR calc non Af Amer 75 (*)    GFR calc Af Amer 87 (*)    All other components within normal limits  CBG MONITORING, ED - Abnormal; Notable for the following:    Glucose-Capillary 110 (*)    All other components within normal limits  RAPID STREP SCREEN  CSF CULTURE  GRAM STAIN  CULTURE, GROUP A STREP  INFLUENZA PANEL BY PCR (TYPE A & B, H1N1)  CSF CELL COUNT WITH DIFFERENTIAL  CSF CELL COUNT WITH DIFFERENTIAL  PROTEIN AND GLUCOSE, CSF    Imaging Review Dg Chest 2 View  07/28/2014   CLINICAL DATA:  Headache, chills since Thursday  EXAM: CHEST  2 VIEW  COMPARISON:  None.  FINDINGS: There is consolidation in the left upper lobe compatible with pneumonia. Right lung is clear. No effusions. Heart and mediastinal contours are  within normal limits. No acute bony abnormality.  IMPRESSION: Left upper lobe pneumonia.   Electronically Signed   By: Charlett NoseKevin  Dover M.D.   On: 07/28/2014 20:57   Ct Head Wo Contrast  07/28/2014   CLINICAL DATA:  Headache, chills, fever for 2 days.  Nausea.  EXAM: CT HEAD WITHOUT CONTRAST  TECHNIQUE: Contiguous axial images were obtained from the base of the skull through the vertex without intravenous contrast.  COMPARISON:  04/07/2007  FINDINGS: No intracranial hemorrhage, mass effect, or midline shift. No hydrocephalus. The basilar cisterns  are patent. No evidence of territorial infarct. No intracranial fluid collection. Calvarium is intact. Included paranasal sinuses and mastoid air cells are well aerated.  IMPRESSION: No acute intracranial abnormality.   Electronically Signed   By: Rubye OaksMelanie  Ehinger M.D.   On: 07/28/2014 20:52     EKG Interpretation None      MDM   Final diagnoses:  Fever  CAP (community acquired pneumonia)   Fever, chills, headache for the past 2 days. Neurologically intact. Concern for viral syndrome. Discussed possibility of meningitis with patient. Patient does have head and neck pain but no true meningismus. He does have a fever. Leukocytosis of 12.  Chest x-ray shows infiltrate in the left upper lobe. Patient has no respiratory symptoms with cough. CT head negative.  Patient agreeable to proceed with lumbar puncture after risks and benefits discussed.  Lumbar puncture performed as above. Patient tolerated well. Results pending at time of sign out to Dr. Jeraldine LootsLockwood.   treat for community acquired pneumonia if CSF results negative.    Glynn OctaveStephen [employer], MD 07/28/14 2206

## 2014-07-28 NOTE — ED Notes (Signed)
Pt tolerated LP without complaint; pt assisted to lay flat on stretcher;

## 2014-07-29 ENCOUNTER — Emergency Department (HOSPITAL_COMMUNITY)
Admission: EM | Admit: 2014-07-29 | Discharge: 2014-07-29 | Disposition: A | Discharge status: Home / Self Care | Payer: 59 | Source: Home / Self Care | Attending: Emergency Medicine | Admitting: Emergency Medicine

## 2014-07-29 ENCOUNTER — Encounter (HOSPITAL_COMMUNITY): Payer: Self-pay | Admitting: Emergency Medicine

## 2014-07-29 DIAGNOSIS — J159 Unspecified bacterial pneumonia: Secondary | ICD-10-CM

## 2014-07-29 DIAGNOSIS — J189 Pneumonia, unspecified organism: Secondary | ICD-10-CM

## 2014-07-29 DIAGNOSIS — B349 Viral infection, unspecified: Secondary | ICD-10-CM | POA: Insufficient documentation

## 2014-07-29 MED ORDER — SODIUM CHLORIDE 0.9 % IV SOLN: INTRAVENOUS | Status: DC

## 2014-07-29 MED ORDER — SODIUM CHLORIDE 0.9 % IV BOLUS (SEPSIS)
1000.0000 mL | Freq: Once | INTRAVENOUS | Status: AC
  Administered 2014-07-29: 1000 mL via INTRAVENOUS

## 2014-07-29 MED ORDER — ONDANSETRON HCL 4 MG/2ML IJ SOLN
4.0000 mg | Freq: Once | INTRAMUSCULAR | Status: AC
  Administered 2014-07-29: 4 mg via INTRAVENOUS
  Filled 2014-07-29: qty 2

## 2014-07-29 MED ORDER — DEXTROSE 5 % IV SOLN
1.0000 g | Freq: Once | INTRAVENOUS | Status: AC
  Administered 2014-07-29: 1 g via INTRAVENOUS
  Filled 2014-07-29: qty 10

## 2014-07-29 MED ORDER — ONDANSETRON 4 MG PO TBDP: 4.0000 mg | ORAL_TABLET | Freq: Three times a day (TID) | ORAL | Status: AC | PRN

## 2014-07-29 MED ORDER — PROMETHAZINE HCL 25 MG PO TABS: 25.0000 mg | ORAL_TABLET | Freq: Four times a day (QID) | ORAL | Status: AC | PRN

## 2014-07-29 NOTE — ED Notes (Signed)
Pt requesting Zofran to take home for the evening due to the pharmacy being closed. Discussed this with Dr. Deretha EmoryZackowski who stated that the pt had received the max dose for the evening and that he had discussed this with the pt previously. Informed pt of Dr. Darlyn ChamberZackowski's statements and pt verbalized understanding. Nad noted. Pt left department via wheelchair and walked to car with steady gait.

## 2014-07-29 NOTE — Discharge Instructions (Signed)
Important to continue taking your antibiotic doxycycline. Take the antinausea medicine Phenergan and/or Zofran as needed to suppress the vomiting. Rest and drink plenty of fluids. Advance to a bland diet as tolerated. Work note provided to be off for the next 2 days. Also would recommend Mucinex DM available over-the-counter for the coughing congestion.

## 2014-07-29 NOTE — ED Provider Notes (Signed)
CSN: [id number]     Arrival date & time 07/29/14  1534 History  This chart was scribed for Vanetta Mulders, MD by Milly Jakob, ED Scribe. The patient was seen in room APA03/APA03. Patient's care was started at 4:14 PM.     Chief Complaint  Patient presents with  . Emesis   Patient is a 54 y.o. male presenting with vomiting and URI. The history is provided by the patient. No language interpreter was used.  Emesis Severity:  Moderate Duration:  1 day Timing:  Intermittent Number of daily episodes:  4 Quality:  Stomach contents Able to tolerate:  Liquids and solids Progression:  Worsening Chronicity:  New Recent urination:  Decreased Relieved by:  None tried Worsened by:  Nothing tried Ineffective treatments:  None tried Associated symptoms: abdominal pain, chills, headaches, sore throat and URI   Associated symptoms: no diarrhea   URI Presenting symptoms: congestion, cough, fever, rhinorrhea and sore throat   Cough:    Cough characteristics:  Productive   Sputum characteristics:  Yellow and pink   Severity:  Moderate   Onset quality:  Sudden   Duration:  1 day   Timing:  Intermittent   Progression:  Worsening   Chronicity:  New Severity:  Moderate Onset quality:  Gradual Duration:  2 days Timing:  Constant Progression:  Worsening Chronicity:  New Relieved by:  Nothing Worsened by:  Nothing tried Ineffective treatments:  None tried Associated symptoms: headaches   Associated symptoms: no neck pain    HPI Comments: Thomas Johnson is a 54 y.o. male who presents to the Emergency Department complaining of vomiting which began last night and became worse this morning. He reports 3-4 episodes of vomiting today. He states that this is exacerbated by eating. He additionally reports mild hemoptysis, with small streaks in the emesis. He reports that he was diagnosed with pneumonia here yesterday, and he was prescribed ABX, but he has not started taking them yet. He additionally  reports dark colored urine, urinary retention, and fatigue. He denies diarrhea. He states that the ruled out meningitis yesterday with a lumbar puncture. He denies any chronic medical conditions and states that he has not been hospitalized for the past 3 months.   WUJ:WJXB, BENJAMIN, PA-C   History reviewed. No pertinent past medical history. Past Surgical History  Procedure Laterality Date  . Shoulder arthrotomy      removal of cyst  . Colonoscopy N/A 11/04/2012    Procedure: COLONOSCOPY;  Surgeon: Corbin Ade, MD;  Location: [facility];  Service: Endoscopy;  Laterality: N/A;  9:30 AM   History reviewed. No pertinent family history. History  Substance Use Topics  . Smoking status: Never Smoker   . Smokeless tobacco: Not on file  . Alcohol Use: No    Review of Systems  Constitutional: Positive for fever and chills.  HENT: Positive for congestion, rhinorrhea and sore throat.   Eyes: Negative for visual disturbance.  Respiratory: Positive for cough and shortness of breath.   Cardiovascular: Negative for chest pain and leg swelling.  Gastrointestinal: Positive for nausea, vomiting and abdominal pain. Negative for diarrhea.  Genitourinary: Positive for decreased urine volume. Negative for dysuria.  Musculoskeletal: Negative for back pain and neck pain.  Skin: Negative for rash.  Neurological: Positive for headaches. Negative for dizziness and light-headedness.  Hematological: Does not bruise/bleed easily.  Psychiatric/Behavioral: Negative for confusion.    Allergies  Review of patient's allergies indicates no known allergies.  Home Medications  Prior to Admission medications   Medication Sig Start Date End Date Taking? Authorizing Provider  cyclobenzaprine (FLEXERIL) 10 MG tablet Take 1 tablet (10 mg total) by mouth 3 (three) times daily as needed. Patient not taking: Reported on 07/28/2014 01/31/13   Tammy L. Triplett, PA-C  doxycycline (VIBRAMYCIN) 100 MG capsule Take 1  capsule (100 mg total) by mouth 2 (two) times daily. 07/28/14   Glynn Octave, MD  HYDROcodone-acetaminophen (NORCO/VICODIN) 5-325 MG per tablet Take one-two tabs po q 4-6 hrs prn pain Patient not taking: Reported on 07/28/2014 01/31/13   Tammy L. Triplett, PA-C  naproxen (NAPROSYN) 500 MG tablet Take 1 tablet (500 mg total) by mouth 2 (two) times daily with a meal. Patient not taking: Reported on 07/28/2014 01/31/13   Tammy L. Triplett, PA-C  ondansetron (ZOFRAN ODT) 4 MG disintegrating tablet Take 1 tablet (4 mg total) by mouth every 8 (eight) hours as needed. 07/29/14   Vanetta Mulders, MD  promethazine (PHENERGAN) 25 MG tablet Take 1 tablet (25 mg total) by mouth every 6 (six) hours as needed. 07/29/14   Vanetta Mulders, MD   Triage Vitals: BP 126/64 mmHg  Pulse 95  Temp(Src) 100.5 F (38.1 C) (Oral)  Resp 18  Ht 5\' 8"  (1.727 m)  Wt 212 lb (96.163 kg)  BMI 32.24 kg/m2  SpO2 95% Physical Exam  Constitutional: He is oriented to person, place, and time. He appears well-developed and well-nourished. No distress.  HENT:  Head: Normocephalic and atraumatic.  Mouth/Throat: Oropharynx is clear and moist.  Eyes: Conjunctivae and EOM are normal. Pupils are equal, round, and reactive to light.  Neck: Neck supple. No tracheal deviation present.  Cardiovascular: Normal rate, regular rhythm and normal heart sounds.   No murmur heard. Pulmonary/Chest: Effort normal. No respiratory distress. He has no wheezes. He has rhonchi. He has no rales.  Abdominal: Soft. Bowel sounds are normal. There is no tenderness.  Musculoskeletal: Normal range of motion. He exhibits no edema.  Neurological: He is alert and oriented to person, place, and time.  Skin: Skin is warm and dry.  Psychiatric: He has a normal mood and affect. His behavior is normal.  Nursing note and vitals reviewed.   ED Course  Procedures (including critical care time) DIAGNOSTIC STUDIES: Oxygen Saturation is 95% on room air, adequate by my  interpretation.    COORDINATION OF CARE: 4:23 PM-Discussed treatment plan which includes IV fluids and ABX with pt at bedside and pt agreed to plan.   Results for orders placed or performed during the hospital encounter of 07/28/14  Rapid strep screen  Result Value Ref Range   Streptococcus, Group A Screen (Direct) NEGATIVE NEGATIVE  Gram stain - STAT with CSF culture  Result Value Ref Range   Specimen Description CSF    Special Requests NONE    Gram Stain NO ORGANISMS SEEN NO WBC SEEN    Report Status 07/28/2014 FINAL   CBC with Differential  Result Value Ref Range   WBC 12.6 (H) 4.0 - 10.5 K/uL   RBC 4.82 4.22 - 5.81 MIL/uL   Hemoglobin 14.0 13.0 - 17.0 g/dL   HCT 16.1 09.6 - 04.5 %   MCV 87.1 78.0 - 100.0 fL   MCH 29.0 26.0 - 34.0 pg   MCHC 33.3 30.0 - 36.0 g/dL   RDW 40.9 81.1 - 91.4 %   Platelets 214 150 - 400 K/uL   Neutrophils Relative % 84 (H) 43 - 77 %   Neutro Abs 10.6 (H) 1.7 -  7.7 K/uL   Lymphocytes Relative 8 (L) 12 - 46 %   Lymphs Abs 1.0 0.7 - 4.0 K/uL   Monocytes Relative 8 3 - 12 %   Monocytes Absolute 1.0 0.1 - 1.0 K/uL   Eosinophils Relative 0 0 - 5 %   Eosinophils Absolute 0.0 0.0 - 0.7 K/uL   Basophils Relative 0 0 - 1 %   Basophils Absolute 0.0 0.0 - 0.1 K/uL  Comprehensive metabolic panel  Result Value Ref Range   Sodium 137 135 - 145 mmol/L   Potassium 3.3 (L) 3.5 - 5.1 mmol/L   Chloride 102 96 - 112 mEq/L   CO2 25 19 - 32 mmol/L   Glucose, Bld 127 (H) 70 - 99 mg/dL   BUN 15 6 - 23 mg/dL   Creatinine, Ser 6.211.11 0.50 - 1.35 mg/dL   Calcium 8.5 8.4 - 30.810.5 mg/dL   Total Protein 7.8 6.0 - 8.3 g/dL   Albumin 4.0 3.5 - 5.2 g/dL   AST 28 0 - 37 U/L   ALT 25 0 - 53 U/L   Alkaline Phosphatase 76 39 - 117 U/L   Total Bilirubin 1.1 0.3 - 1.2 mg/dL   GFR calc non Af Amer 75 (L) >90 mL/min   GFR calc Af Amer 87 (L) >90 mL/min   Anion gap 10 5 - 15  Influenza panel by pcr  Result Value Ref Range   Influenza A By PCR NEGATIVE NEGATIVE   Influenza B By  PCR NEGATIVE NEGATIVE   H1N1 flu by pcr NOT DETECTED NOT DETECTED  CSF cell count with differential collection tube #: 4  Result Value Ref Range   Tube # 4    Color, CSF COLORLESS COLORLESS   Appearance, CSF CLEAR CLEAR   Supernatant NOT INDICATED    RBC Count, CSF 0 0 /cu mm   WBC, CSF 0 0 - 5 /cu mm   Segmented Neutrophils-CSF 0 0 - 6 %   Lymphs, CSF 0 (L) 40 - 80 %   Monocyte-Macrophage-Spinal Fluid 0 (L) 15 - 45 %   Eosinophils, CSF 0 0 - 1 %   Other Cells, CSF ONLY 7 CELLS SEEN   Protein and glucose, CSF  Result Value Ref Range   Glucose, CSF 73 43 - 76 mg/dL   Total  Protein, CSF 40 15 - 45 mg/dL  POC CBG, ED  Result Value Ref Range   Glucose-Capillary 110 (H) 70 - 99 mg/dL   Dg Chest 2 View  6/57/84691/16/2016   CLINICAL DATA:  Headache, chills since Thursday  EXAM: CHEST  2 VIEW  COMPARISON:  None.  FINDINGS: There is consolidation in the left upper lobe compatible with pneumonia. Right lung is clear. No effusions. Heart and mediastinal contours are within normal limits. No acute bony abnormality.  IMPRESSION: Left upper lobe pneumonia.   Electronically Signed   By: Charlett NoseKevin  Dover M.D.   On: 07/28/2014 20:57   Ct Head Wo Contrast  07/28/2014   CLINICAL DATA:  Headache, chills, fever for 2 days.  Nausea.  EXAM: CT HEAD WITHOUT CONTRAST  TECHNIQUE: Contiguous axial images were obtained from the base of the skull through the vertex without intravenous contrast.  COMPARISON:  04/07/2007  FINDINGS: No intracranial hemorrhage, mass effect, or midline shift. No hydrocephalus. The basilar cisterns are patent. No evidence of territorial infarct. No intracranial fluid collection. Calvarium is intact. Included paranasal sinuses and mastoid air cells are well aerated.  IMPRESSION: No acute intracranial abnormality.  Electronically Signed   By: Rubye Oaks M.D.   On: 07/28/2014 20:52    Medications  0.9 %  sodium chloride infusion (not administered)  sodium chloride 0.9 % bolus 1,000 mL (0 mLs  Intravenous Stopped 07/29/14 1745)  ondansetron (ZOFRAN) injection 4 mg (4 mg Intravenous Given 07/29/14 1648)  cefTRIAXone (ROCEPHIN) 1 g in dextrose 5 % 50 mL IVPB (0 g Intravenous Stopped 07/29/14 1745)  sodium chloride 0.9 % bolus 1,000 mL (0 mLs Intravenous Stopped 07/29/14 1848)  ondansetron (ZOFRAN) injection 4 mg (4 mg Intravenous Given 07/29/14 1805)     EKG Interpretation None      MDM   Final diagnoses:  CAP (community acquired pneumonia)  Viral illness    Patient seen yesterday with extensive workup for fever and headache including lumbar puncture which was negative. Influenza test was done then that was also negative. Since that time patients develop new symptoms that seem to be consistent with an upper respiratory infection and a viral gastritis. Patient with multiple episodes of vomiting today. Patient has developed a cough that is productive. Patient also clinically seem to be a bit dehydrated urine was dark in color. Patient received 2 L of normal saline here feeling better received antinausea medicine overall feeling improved from when he arrived. Patient will be discharged home with work note for 2 days. Be started on 2 antinausea medicines Zofran and Phenergan and also with the recommendation start Mucinex DM available over-the-counter.   I personally performed the services described in this documentation, which was scribed in my presence. The recorded information has been reviewed and is accurate.     Vanetta Mulders, MD 07/29/14 1924

## 2014-07-29 NOTE — ED Notes (Signed)
Pt reports was seen last night and diagnosed with pneumonia. Pt reports this am began vomiting and noticed dark color urine. Pt reports generalized malaise.

## 2014-07-29 NOTE — ED Notes (Signed)
MD at bedside. 

## 2014-07-31 LAB — CULTURE, GROUP A STREP

## 2014-08-02 LAB — CSF CULTURE W GRAM STAIN

## 2014-08-02 LAB — CSF CULTURE
CULTURE: NO GROWTH
GRAM STAIN: NONE SEEN

## 2016-03-02 ENCOUNTER — Encounter (HOSPITAL_COMMUNITY): Payer: Self-pay | Admitting: Emergency Medicine

## 2016-03-02 ENCOUNTER — Emergency Department (HOSPITAL_COMMUNITY)
Admission: EM | Admit: 2016-03-02 | Discharge: 2016-03-03 | Disposition: A | Discharge status: Home / Self Care | Payer: Non-veteran care | Attending: Emergency Medicine | Admitting: Emergency Medicine

## 2016-03-02 DIAGNOSIS — Z792 Long term (current) use of antibiotics: Secondary | ICD-10-CM | POA: Insufficient documentation

## 2016-03-02 DIAGNOSIS — R112 Nausea with vomiting, unspecified: Secondary | ICD-10-CM | POA: Diagnosis not present

## 2016-03-02 DIAGNOSIS — Z79899 Other long term (current) drug therapy: Secondary | ICD-10-CM | POA: Insufficient documentation

## 2016-03-02 DIAGNOSIS — R1084 Generalized abdominal pain: Secondary | ICD-10-CM

## 2016-03-02 LAB — COMPREHENSIVE METABOLIC PANEL
ALBUMIN: 4.9 g/dL (ref 3.5–5.0)
ALT: 19 U/L (ref 17–63)
AST: 20 U/L (ref 15–41)
Alkaline Phosphatase: 62 U/L (ref 38–126)
Anion gap: 9 (ref 5–15)
BILIRUBIN TOTAL: 0.9 mg/dL (ref 0.3–1.2)
BUN: 16 mg/dL (ref 6–20)
CO2: 30 mmol/L (ref 22–32)
CREATININE: 0.92 mg/dL (ref 0.61–1.24)
Calcium: 9.6 mg/dL (ref 8.9–10.3)
Chloride: 104 mmol/L (ref 101–111)
GFR calc Af Amer: >60 mL/min (ref >60)
GFR calc non Af Amer: >60 mL/min (ref >60)
Glucose, Bld: 116 mg/dL — ABNORMAL HIGH (ref 65–99)
Potassium: 3.6 mmol/L (ref 3.5–5.1)
SODIUM: 143 mmol/L (ref 135–145)
Total Protein: 8.1 g/dL (ref 6.5–8.1)

## 2016-03-02 LAB — CBC
HEMATOCRIT: 46.6 % (ref 39.0–52.0)
Hemoglobin: 15.4 g/dL (ref 13.0–17.0)
MCH: 29.1 pg (ref 26.0–34.0)
MCHC: 33 g/dL (ref 30.0–36.0)
MCV: 88.1 fL (ref 78.0–100.0)
Platelets: 251 10*3/uL (ref 150–400)
RBC: 5.29 MIL/uL (ref 4.22–5.81)
RDW: 13.6 % (ref 11.5–15.5)
WBC: 9.5 10*3/uL (ref 4.0–10.5)

## 2016-03-02 LAB — URINALYSIS, ROUTINE W REFLEX MICROSCOPIC
BILIRUBIN URINE: NEGATIVE
Glucose, UA: NEGATIVE mg/dL
Ketones, ur: NEGATIVE mg/dL
Leukocytes, UA: NEGATIVE
Nitrite: NEGATIVE
PH: 8 (ref 5.0–8.0)
Protein, ur: 100 mg/dL — AB
Specific Gravity, Urine: 1.015 (ref 1.005–1.030)

## 2016-03-02 LAB — URINE MICROSCOPIC-ADD ON

## 2016-03-02 LAB — LIPASE, BLOOD: LIPASE: 20 U/L (ref 11–51)

## 2016-03-02 NOTE — ED Provider Notes (Signed)
[facility] Provider Note   CSN: [id number] Arrival date & time: 03/02/16  1801 By signing my name below, I, Thomas Johnson, attest that this documentation has been prepared under the direction and in the presence of [name], MD. Electronically Signed: Bridgette Johnson, ED Scribe. 03/03/16. 12:16 AM.  Time Seen 12:06 AM  History   Chief Complaint Chief Complaint  Patient presents with  . Abdominal Pain  . Emesis   HPI Comments: Thomas Johnson is a 55 y.o. male who presents to the Emergency Department complaining of sudden onset, constant, cramping, generalized abdominal pain onset 3 days ago, August 19 at 3:30 in the morning. Pt also has associated intermittent, sharp chest pain (lasts 2-3 hours) and vomiting. Pt states there is a burning sensation in his throat after he vomits. He notes mild hematemesis stating at the end of the vomiting or if he gags really hard he see some streaks of blood. He estimates he has vomited at least 12 times. Pt went to the [place] [facility] in [place] on the same day the symptoms began and they prescribed him alpha lipoic acid and Naproxen for complaints of pain in his hip that was felt to be bursitis with no relief to his symptoms. He denies h/o similar symptoms. Pt is on no daily medications at this time. Pt is not a smoker. He is an occasional drinker. Denies fever, diarrhea, diaphoresis, dizziness, lightheadedness, or any other associated symptoms. He has noted some decreased urinary output and states he's been able to eat or drink because of the vomiting. Reviewing patient's chart he has been on naproxen from our ER about 2 years ago and he states he had no problems taking it then. He also relates a history of aspirin causing heartburn and stomach aching.  The history is provided by the patient. No language interpreter was used.    PCP [facility] in [place]  History reviewed. No pertinent past medical history.  There are no active problems to display for this  patient.   Past Surgical History:  Procedure Laterality Date  . COLONOSCOPY N/A 11/04/2012   Procedure: COLONOSCOPY;  Surgeon: Corbin Ade, MD;  Location: [facility];  Service: Endoscopy;  Laterality: N/A;  9:30 AM  . SHOULDER ARTHROTOMY     removal of cyst       Home Medications    none  Prior to Admission medications   Medication Sig Start Date End Date Taking? Authorizing Provider  cyclobenzaprine (FLEXERIL) 10 MG tablet Take 1 tablet (10 mg total) by mouth 3 (three) times daily as needed. Patient not taking: Reported on 07/28/2014 01/31/13   Tammy Triplett, PA-C  doxycycline (VIBRAMYCIN) 100 MG capsule Take 1 capsule (100 mg total) by mouth 2 (two) times daily. 07/28/14   Glynn Octave, MD  HYDROcodone-acetaminophen (NORCO/VICODIN) 5-325 MG per tablet Take one-two tabs po q 4-6 hrs prn pain Patient not taking: Reported on 07/28/2014 01/31/13   Tammy Triplett, PA-C  naproxen (NAPROSYN) 500 MG tablet Take 1 tablet (500 mg total) by mouth 2 (two) times daily with a meal. Patient not taking: Reported on 07/28/2014 01/31/13   Tammy Triplett, PA-C  omeprazole (PRILOSEC) 20 MG capsule Take 1 po BID x 2 weeks then once a day 03/03/16   [name], MD  ondansetron (ZOFRAN ODT) 4 MG disintegrating tablet Take 1 tablet (4 mg total) by mouth every 8 (eight) hours as needed. 07/29/14   Vanetta Mulders, MD  ondansetron (ZOFRAN) 4 MG tablet Take 1 tablet (4  mg total) by mouth every 8 (eight) hours as needed. 03/03/16   Devoria AlbeIva Ikram Riebe, MD  promethazine (PHENERGAN) 25 MG tablet Take 1 tablet (25 mg total) by mouth every 6 (six) hours as needed. 07/29/14   Vanetta MuldersScott Zackowski, MD    Family History History reviewed. No pertinent family history.  Social History Social History  Substance Use Topics  . Smoking status: Never Smoker  . Smokeless tobacco: Never Used  . Alcohol use Yes     Comment: rarely   employed  Allergies   Review of patient's allergies indicates no known allergies.   Review of  Systems Review of Systems  Constitutional: Negative for diaphoresis and fever.  Gastrointestinal: Positive for abdominal pain and vomiting. Negative for diarrhea.  Neurological: Negative for dizziness and light-headedness.  All other systems reviewed and are negative.    Physical Exam Updated Vital Signs BP 127/84   Pulse 62   Temp 98.6 F (37 C) (Oral)   Resp 18   Ht 5\' 8"  (1.727 m)   Wt 200 lb (90.7 kg)   SpO2 95%   BMI 30.41 kg/m   Vital signs normal    Physical Exam  Constitutional: He is oriented to person, place, and time. He appears well-developed and well-nourished.  Non-toxic appearance. He does not appear ill. No distress.  HENT:  Head: Normocephalic and atraumatic.  Right Ear: External ear normal.  Left Ear: External ear normal.  Nose: Nose normal. No mucosal edema or rhinorrhea.  Mouth/Throat: Oropharynx is clear and moist and mucous membranes are normal. No dental abscesses or uvula swelling.  Eyes: Conjunctivae and EOM are normal. Pupils are equal, round, and reactive to light.  Neck: Normal range of motion and full passive range of motion without pain. Neck supple.  Cardiovascular: Normal rate, regular rhythm and normal heart sounds.  Exam reveals no gallop and no friction rub.   No murmur heard. Pulmonary/Chest: Effort normal and breath sounds normal. No respiratory distress. He has no wheezes. He has no rhonchi. He has no rales. He exhibits no tenderness and no crepitus.  Abdominal: Soft. Normal appearance and bowel sounds are normal. He exhibits no distension. There is no tenderness. There is no rebound and no guarding.  Very active bowel sounds, abdomen is nontender.  Musculoskeletal: Normal range of motion. He exhibits no edema or tenderness.  Moves all extremities well.   Neurological: He is alert and oriented to person, place, and time. He has normal strength. No cranial nerve deficit.  Skin: Skin is warm, dry and intact. No rash noted. No erythema. No  pallor.  Psychiatric: He has a normal mood and affect. His speech is normal and behavior is normal. His mood appears not anxious.  Nursing note and vitals reviewed.    ED Treatments / Results  DIAGNOSTIC STUDIES: Oxygen Saturation is 97% on RA, adequate by my interpretation.      Labs (all labs ordered are listed, but only abnormal results are displayed) Results for orders placed or performed during the hospital encounter of 03/02/16  Lipase, blood  Result Value Ref Range   Lipase 20 11 - 51 U/L  Comprehensive metabolic panel  Result Value Ref Range   Sodium 143 135 - 145 mmol/L   Potassium 3.6 3.5 - 5.1 mmol/L   Chloride 104 101 - 111 mmol/L   CO2 30 22 - 32 mmol/L   Glucose, Bld 116 (H) 65 - 99 mg/dL   BUN 16 6 - 20 mg/dL   Creatinine, Ser 1.610.92  0.61 - 1.24 mg/dL   Calcium 9.6 8.9 - 96.010.3 mg/dL   Total Protein 8.1 6.5 - 8.1 g/dL   Albumin 4.9 3.5 - 5.0 g/dL   AST 20 15 - 41 U/L   ALT 19 17 - 63 U/L   Alkaline Phosphatase 62 38 - 126 U/L   Total Bilirubin 0.9 0.3 - 1.2 mg/dL   GFR calc non Af Amer >60 >60 mL/min   GFR calc Af Amer >60 >60 mL/min   Anion gap 9 5 - 15  CBC  Result Value Ref Range   WBC 9.5 4.0 - 10.5 K/uL   RBC 5.29 4.22 - 5.81 MIL/uL   Hemoglobin 15.4 13.0 - 17.0 g/dL   HCT 45.446.6 09.839.0 - 11.952.0 %   MCV 88.1 78.0 - 100.0 fL   MCH 29.1 26.0 - 34.0 pg   MCHC 33.0 30.0 - 36.0 g/dL   RDW 14.713.6 82.911.5 - 56.215.5 %   Platelets 251 150 - 400 K/uL  Urinalysis, Routine w reflex microscopic  Result Value Ref Range   Color, Urine AMBER (A) YELLOW   APPearance CLEAR CLEAR   Specific Gravity, Urine 1.015 1.005 - 1.030   pH 8.0 5.0 - 8.0   Glucose, UA NEGATIVE NEGATIVE mg/dL   Hgb urine dipstick TRACE (A) NEGATIVE   Bilirubin Urine NEGATIVE NEGATIVE   Ketones, ur NEGATIVE NEGATIVE mg/dL   Protein, ur 130100 (A) NEGATIVE mg/dL   Nitrite NEGATIVE NEGATIVE   Leukocytes, UA NEGATIVE NEGATIVE  Urine microscopic-add on  Result Value Ref Range   Squamous Epithelial / LPF 0-5  (A) NONE SEEN   WBC, UA 0-5 0 - 5 WBC/hpf   RBC / HPF 6-30 0 - 5 RBC/hpf   Bacteria, UA MANY (A) NONE SEEN   Laboratory interpretation all normal except  For bacteria and hematuria (patient states he's been evaluated for hematuria in the past by his PCP)        Procedures Procedures (including critical care time)  Medications Ordered in ED Medications  sodium chloride 0.9 % bolus 1,000 mL (0 mLs Intravenous Stopped 03/03/16 0200)  sodium chloride 0.9 % bolus 1,000 mL (0 mLs Intravenous Stopped 03/03/16 0200)  famotidine (PEPCID) IVPB 20 mg premix (0 mg Intravenous Stopped 03/03/16 0113)  ondansetron (ZOFRAN) injection 4 mg (4 mg Intravenous Given 03/03/16 0024)     Initial Impression / Assessment and Plan / ED Course  I have reviewed the triage vital signs and the nursing notes.  Pertinent labs & imaging results that were available during my care of the patient were reviewed by me and considered in my medical decision making (see chart for details).  Clinical Course   12:13 AM Discussed treatment plan with pt at bedside which includes IVF and IV pepcid and zofran and pt agreed to plan.  Recheck 03:30 am patient sleeping, states he is feeling much better. Will discharge home with prilosec and advised to stop the naproxen for now, even though he has taken it in the past without problems.    Final Clinical Impressions(s) / ED Diagnoses   Final diagnoses:  Generalized abdominal pain  Nausea and vomiting, vomiting of unspecified type    New Prescriptions New Prescriptions   OMEPRAZOLE (PRILOSEC) 20 MG CAPSULE    Take 1 po BID x 2 weeks then once a day   ONDANSETRON (ZOFRAN) 4 MG TABLET    Take 1 tablet (4 mg total) by mouth every 8 (eight) hours as needed.   Plan discharge  Catalia Massett  Lynelle Doctor, MD, FACEP   I personally performed the services described in this documentation, which was scribed in my presence. The recorded information has been reviewed and considered.  [name],  MD, Concha Pyo, MD 03/03/16 [phone]

## 2016-03-02 NOTE — ED Triage Notes (Signed)
Patient complaining of abdominal pain and vomiting x 3 days.

## 2016-03-03 MED ORDER — FAMOTIDINE IN NACL 20-0.9 MG/50ML-% IV SOLN
20.0000 mg | Freq: Once | INTRAVENOUS | Status: AC
  Administered 2016-03-03: 20 mg via INTRAVENOUS
  Filled 2016-03-03: qty 50

## 2016-03-03 MED ORDER — ONDANSETRON HCL 4 MG/2ML IJ SOLN
4.0000 mg | Freq: Once | INTRAMUSCULAR | Status: AC
  Administered 2016-03-03: 4 mg via INTRAVENOUS
  Filled 2016-03-03: qty 2

## 2016-03-03 MED ORDER — SODIUM CHLORIDE 0.9 % IV BOLUS (SEPSIS)
1000.0000 mL | Freq: Once | INTRAVENOUS | Status: AC
  Administered 2016-03-03: 1000 mL via INTRAVENOUS

## 2016-03-03 MED ORDER — ONDANSETRON HCL 4 MG PO TABS: 4.0000 mg | ORAL_TABLET | Freq: Three times a day (TID) | ORAL | 0 refills | Status: AC | PRN

## 2016-03-03 MED ORDER — OMEPRAZOLE 20 MG PO CPDR: DELAYED_RELEASE_CAPSULE | ORAL | 0 refills | Status: AC

## 2016-03-03 NOTE — Discharge Instructions (Signed)
Stop the naproxen for now, that may be what is causing your abdominal discomfort. This morning drink plenty of fluids, if you're feeling better at lunch time you can start a bland diet such as toast, crackers, Jell-O, or [employer] chicken noodle soup. Avoid any fried, spicy, or greasy foods for the next 2 weeks.  Use the Zofran if needed for nausea or vomiting. Start the omeprazole twice a day as directed. [facility] pain that you were started on the naproxen. Left them know you did have some stomach problems after he started it. Return or be rechecked by [facility] if you continue to have abdominal pain or have uncontrolled vomiting.

## 2016-08-21 IMAGING — CT CT HEAD W/O CM
1 series · 16 of 30 positions shown, 20 images · non-contrast
Comparison: 04/07/2007

CLINICAL DATA: Headache, chills, fever for 2 days.  Nausea.

EXAM:
CT HEAD WITHOUT CONTRAST
TECHNIQUE: Contiguous axial images were obtained from the base of the skull
through the vertex without intravenous contrast.

[Series 2: headseq 4.8 h37s · axial · 0.45mm/px · z∈[+92,+246]mm · 16 of 36 slices shown, 20 images]
[im 2/36  brain]
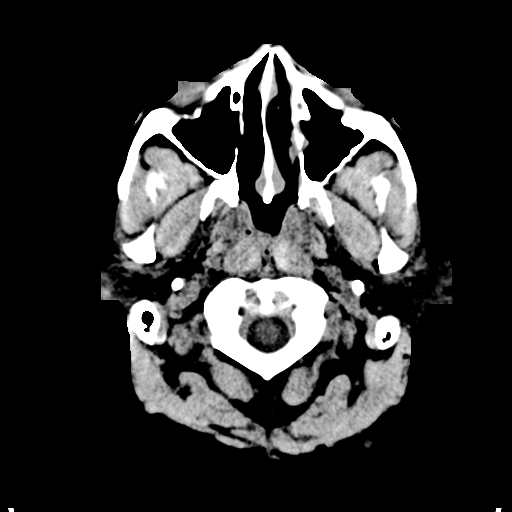
[im 2/36  bone]
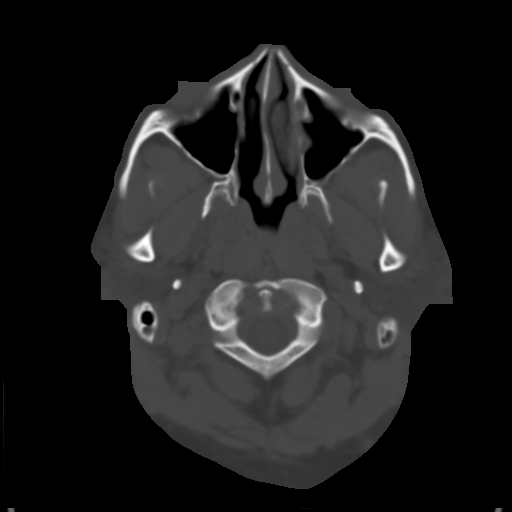
[im 4/36  brain]
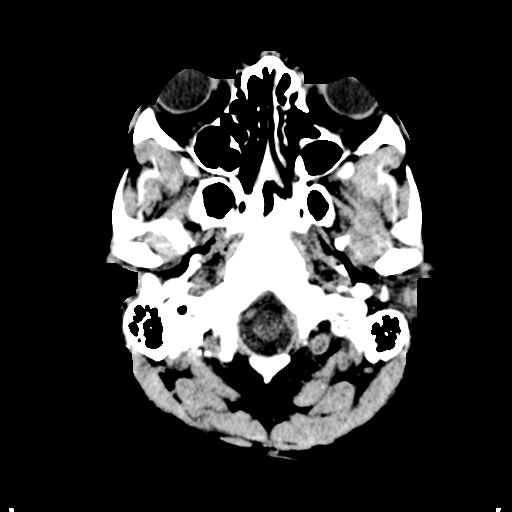
[im 7/36  brain]
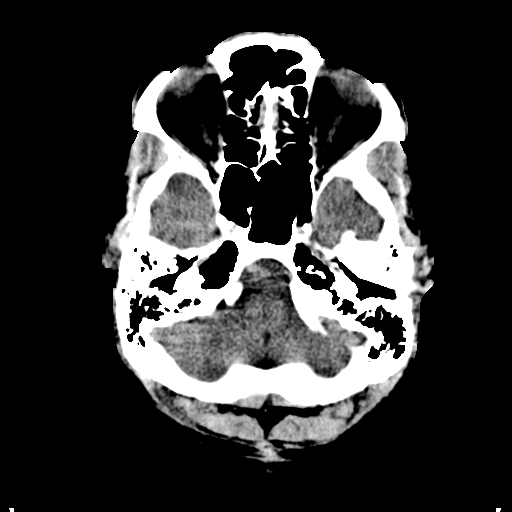
[im 9/36  brain]
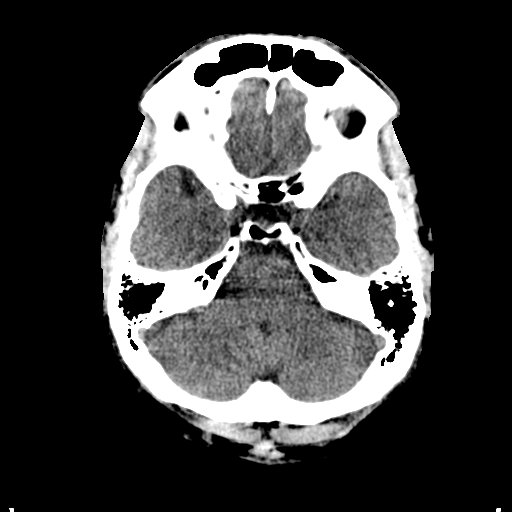
[im 10/36  brain]
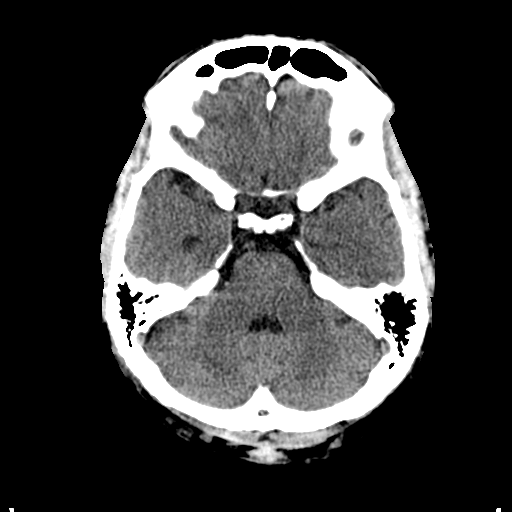
[im 10/36  bone]
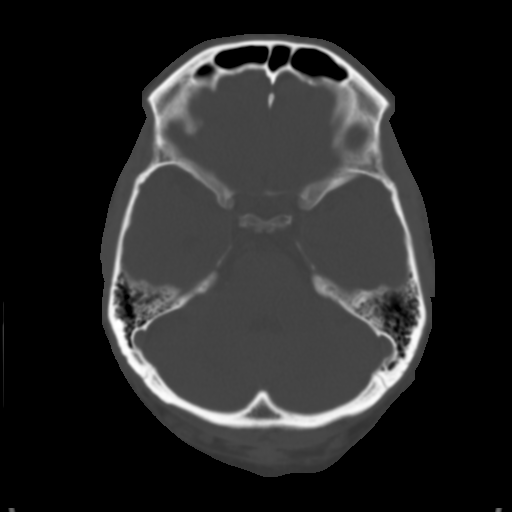
[im 13/36  brain]
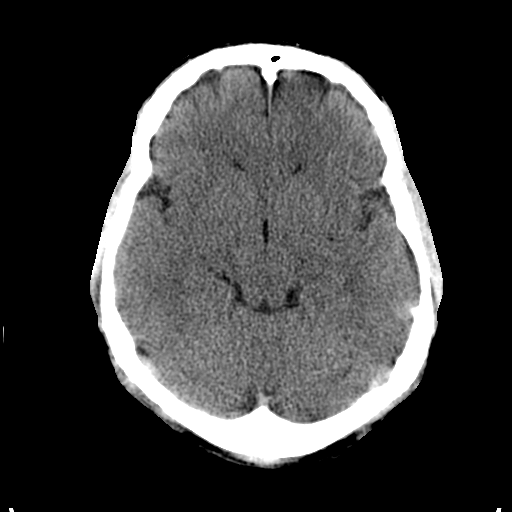
[im 15/36  brain]
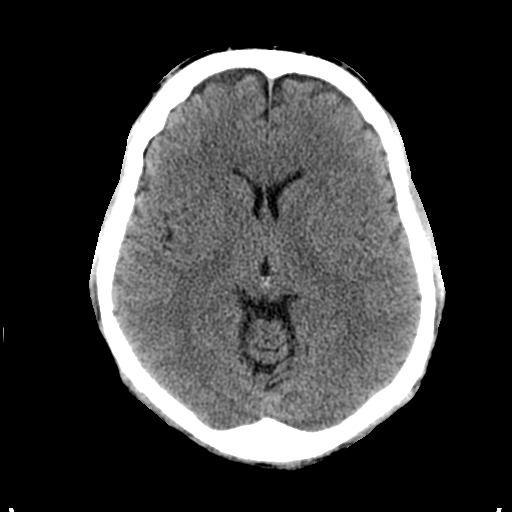
[im 17/36  brain]
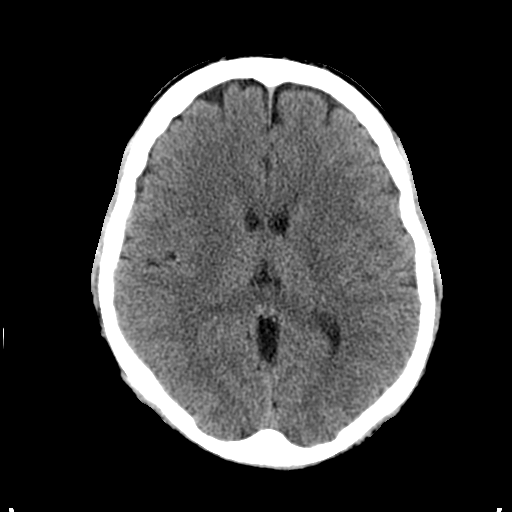
[im 19/36  brain]
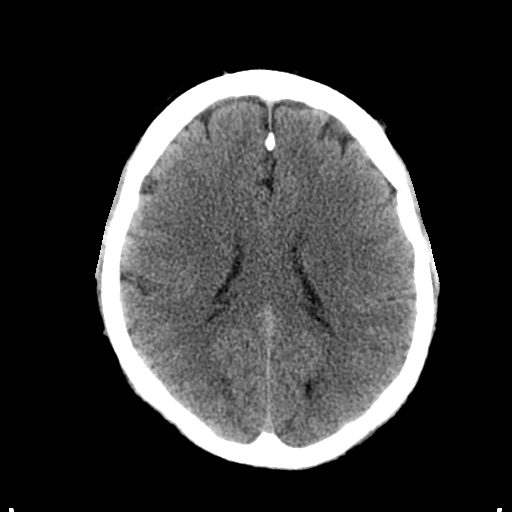
[im 19/36  bone]
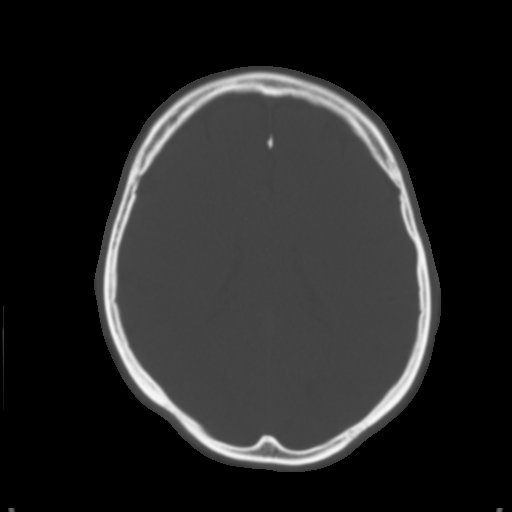
[im 21/36  brain]
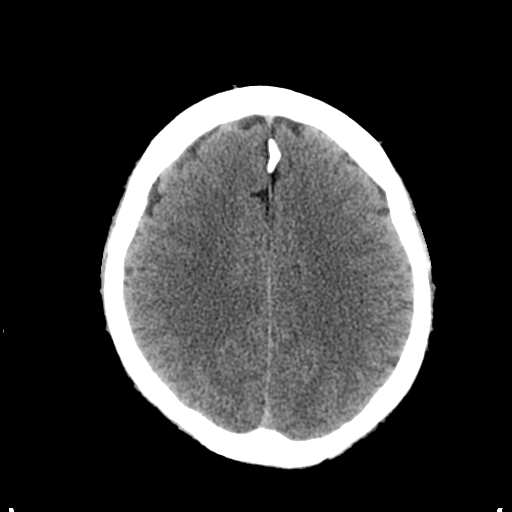
[im 23/36  brain]
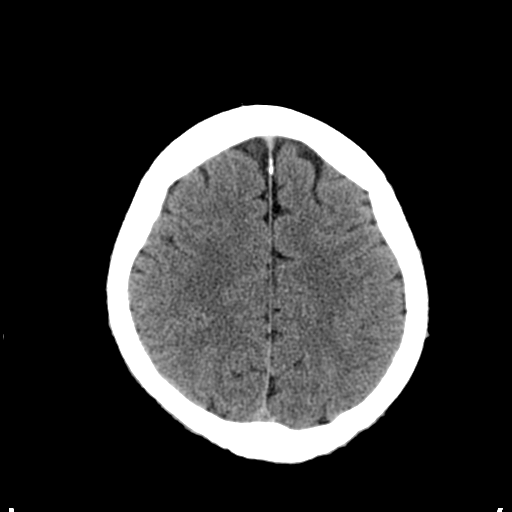
[im 26/36  brain]
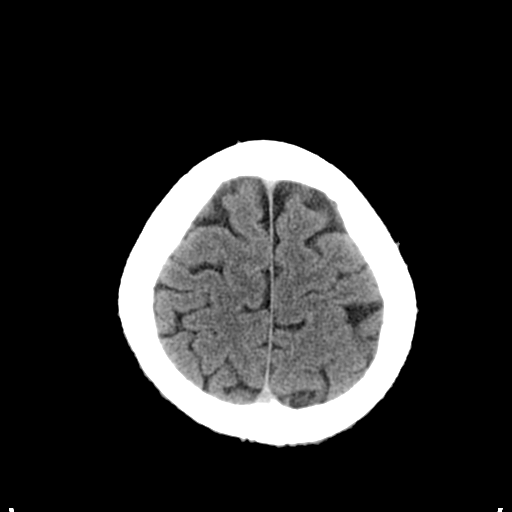
[im 27/36  brain]
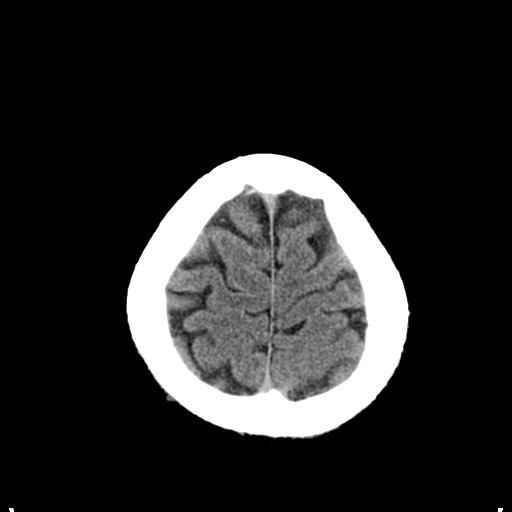
[im 27/36  bone]
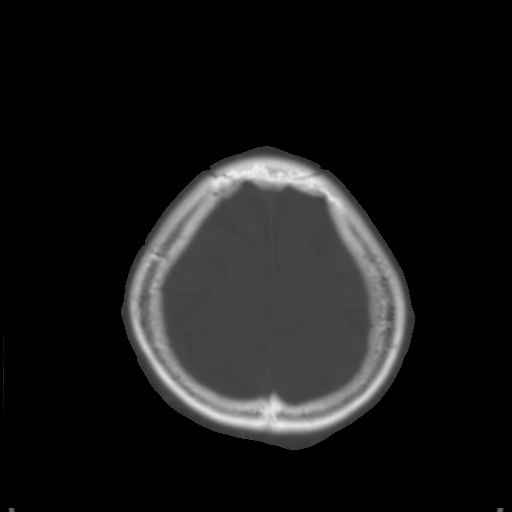
[im 29/36  brain]
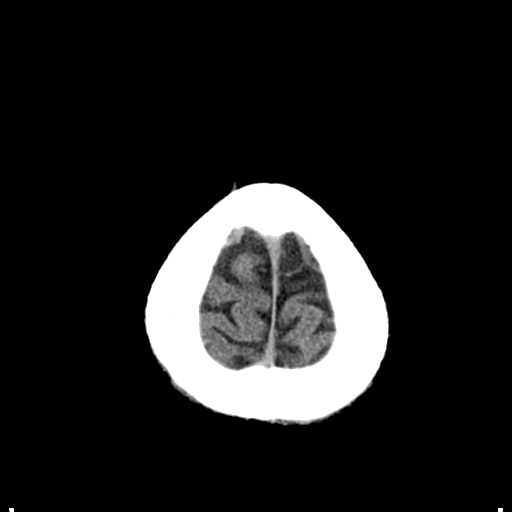
[im 32/36  brain]
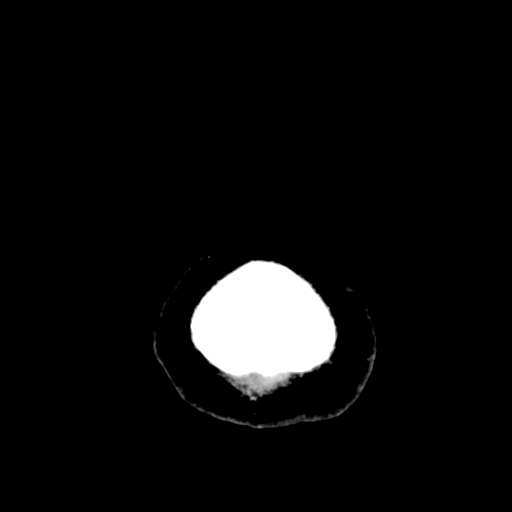
[im 34/36  brain]
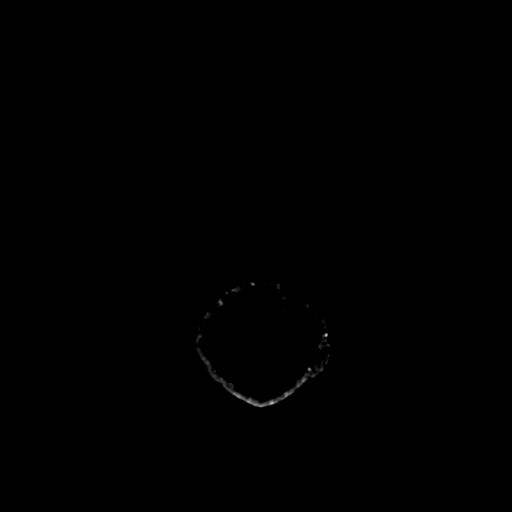

[16 of 30 positions shown; findings below may reference images not displayed]

FINDINGS: No intracranial hemorrhage, mass effect, or midline shift. No
hydrocephalus. The basilar cisterns are patent. No evidence of
territorial infarct. No intracranial fluid collection. Calvarium is
intact. Included paranasal sinuses and mastoid air cells are well
aerated.
IMPRESSION: No acute intracranial abnormality.

## 2020-03-04 ENCOUNTER — Other Ambulatory Visit: Payer: Self-pay

## 2020-03-04 ENCOUNTER — Ambulatory Visit
Admission: EM | Admit: 2020-03-04 | Discharge: 2020-03-04 | Disposition: A | Discharge status: Home / Self Care | Payer: No Typology Code available for payment source | Attending: Emergency Medicine | Admitting: Emergency Medicine

## 2020-03-04 DIAGNOSIS — R350 Frequency of micturition: Secondary | ICD-10-CM

## 2020-03-04 DIAGNOSIS — J069 Acute upper respiratory infection, unspecified: Secondary | ICD-10-CM

## 2020-03-04 DIAGNOSIS — Z1152 Encounter for screening for COVID-19: Secondary | ICD-10-CM

## 2020-03-04 LAB — POCT URINALYSIS DIP (MANUAL ENTRY)
Bilirubin, UA: NEGATIVE
Glucose, UA: NEGATIVE mg/dL
Ketones, POC UA: NEGATIVE mg/dL
Leukocytes, UA: NEGATIVE
Nitrite, UA: NEGATIVE
Protein Ur, POC: 30 mg/dL — AB
Spec Grav, UA: 1.025 (ref 1.010–1.025)
Urobilinogen, UA: 1 E.U./dL
pH, UA: 6 (ref 5.0–8.0)

## 2020-03-04 MED ORDER — BENZONATATE 100 MG PO CAPS: 100.0000 mg | ORAL_CAPSULE | Freq: Three times a day (TID) | ORAL | 0 refills | Status: AC

## 2020-03-04 MED ORDER — PREDNISONE 10 MG PO TABS: 20.0000 mg | ORAL_TABLET | Freq: Every day | ORAL | 0 refills | Status: AC

## 2020-03-04 NOTE — ED Provider Notes (Signed)
[facility]   CC: Burning with urination  SUBJECTIVE:  Thomas Johnson is a 59 y.o. male who presents to the urgent care for complaint of back pain, urinary frequency for the past 2 days.  Patient denies a precipitating event, recent sexual encounter, excessive caffeine intake.  Localizes the pain to the  lower back.  Pain is intermittent and described as achy in character.  Has tried OTC medications without relief.  Symptoms are made worse with urination.  Admits to similar symptoms in the past.  Denies fever, chills, nausea, vomiting, abdominal pain, flank pain, abnormal vaginal discharge or bleeding, hematuria.     Patient is also complaining of upper respiratory symptoms such as nasal congestion and cough for the past 3 days.  Denies sick exposure to COVID flu or strep.  Like to have a Covid test completed.  LMP: No LMP for male patient.  ROS: As in HPI.  All other pertinent ROS negative.     History reviewed. No pertinent past medical history. Past Surgical History:  Procedure Laterality Date  . COLONOSCOPY N/A 11/04/2012   Procedure: COLONOSCOPY;  Surgeon: Corbin Ade, MD;  Location: [facility];  Service: Endoscopy;  Laterality: N/A;  9:30 AM  . SHOULDER ARTHROTOMY     removal of cyst   No Known Allergies No current facility-administered medications on file prior to encounter.   Current Outpatient Medications on File Prior to Encounter  Medication Sig Dispense Refill  . cyclobenzaprine (FLEXERIL) 10 MG tablet Take 1 tablet (10 mg total) by mouth 3 (three) times daily as needed. (Patient not taking: Reported on 07/28/2014) 21 tablet 0  . doxycycline (VIBRAMYCIN) 100 MG capsule Take 1 capsule (100 mg total) by mouth 2 (two) times daily. 20 capsule 0  . HYDROcodone-acetaminophen (NORCO/VICODIN) 5-325 MG per tablet Take one-two tabs po q 4-6 hrs prn pain (Patient not taking: Reported on 07/28/2014) 20 tablet 0  . naproxen (NAPROSYN) 500 MG tablet Take 1 tablet (500  mg total) by mouth 2 (two) times daily with a meal. (Patient not taking: Reported on 07/28/2014) 20 tablet 0  . omeprazole (PRILOSEC) 20 MG capsule Take 1 po BID x 2 weeks then once a day 60 capsule 0  . ondansetron (ZOFRAN ODT) 4 MG disintegrating tablet Take 1 tablet (4 mg total) by mouth every 8 (eight) hours as needed. 10 tablet 0  . ondansetron (ZOFRAN) 4 MG tablet Take 1 tablet (4 mg total) by mouth every 8 (eight) hours as needed. 6 tablet 0  . promethazine (PHENERGAN) 25 MG tablet Take 1 tablet (25 mg total) by mouth every 6 (six) hours as needed. 12 tablet 0   Social History   Socioeconomic History  . Marital status: Married    Spouse name: Not on file  . Number of children: Not on file  . Years of education: Not on file  . Highest education level: Not on file  Occupational History  . Not on file  Tobacco Use  . Smoking status: Never Smoker  . Smokeless tobacco: Never Used  Substance and Sexual Activity  . Alcohol use: Yes    Comment: rarely  . Drug use: No  . Sexual activity: Yes    Birth control/protection: None  Other Topics Concern  . Not on file  Social History Narrative  . Not on file   Social Determinants of Health   Financial Resource Strain:   . Difficulty of Paying Living Expenses: Not on file  Food Insecurity:   .  Worried About Programme researcher, broadcasting/film/video in the Last Year: Not on file  . Ran Out of Food in the Last Year: Not on file  Transportation Needs:   . Lack of Transportation (Medical): Not on file  . Lack of Transportation (Non-Medical): Not on file  Physical Activity:   . Days of Exercise per Week: Not on file  . Minutes of Exercise per Session: Not on file  Stress:   . Feeling of Stress : Not on file  Social Connections:   . Frequency of Communication with Friends and Family: Not on file  . Frequency of Social Gatherings with Friends and Family: Not on file  . Attends Religious Services: Not on file  . Active Member of Clubs or Organizations: Not  on file  . Attends Banker Meetings: Not on file  . Marital Status: Not on file  Intimate Partner Violence:   . Fear of Current or Ex-Partner: Not on file  . Emotionally Abused: Not on file  . Physically Abused: Not on file  . Sexually Abused: Not on file   Family History  Family history unknown: Yes    OBJECTIVE:  Vitals:   03/04/20 1314  BP: (!) 164/86  Pulse: 95  Resp: 16  Temp: 98.6 F (37 C)  TempSrc: Oral  SpO2: 97%   General appearance: AOx3 in no acute distress HEENT: NCAT.  Oropharynx clear.  Lungs: Wheezing present to auscultation  Heart: regular rate and rhythm.  Radial pulses 2+ symmetrical bilaterally Abdomen: soft; non-distended; no tenderness; bowel sounds present; no guarding or rebound tenderness Back: no CVA tenderness Extremities: no edema; symmetrical with no gross deformities Skin: warm and dry Neurologic: Ambulates from chair to exam table without difficulty Psychological: alert and cooperative; normal mood and affect  Labs Reviewed  POCT URINALYSIS DIP (MANUAL ENTRY) - Abnormal; Notable for the following components:      Result Value   Blood, UA trace-intact (*)    Protein Ur, POC =30 (*)    All other components within normal limits  NOVEL CORONAVIRUS, NAA  URINE CULTURE    ASSESSMENT & PLAN:  1. Viral URI with cough   2. Encounter for screening for COVID-19   3. Urine frequency     Meds ordered this encounter  Medications  . predniSONE (DELTASONE) 10 MG tablet    Sig: Take 2 tablets (20 mg total) by mouth daily.    Dispense:  15 tablet    Refill:  0  . benzonatate (TESSALON) 100 MG capsule    Sig: Take 1 capsule (100 mg total) by mouth every 8 (eight) hours.    Dispense:  30 capsule    Refill:  0   Discharge instructions Urine culture sent.  We will call you with the results.   Push fluids and get plenty of rest.    COVID testing ordered.  It will take between 2-7 days for test results.  Someone will contact you  regarding abnormal results.    In the meantime: You should remain isolated in your home for 10 days from symptom onset AND greater than 72 hours after symptoms resolution (absence of fever without the use of fever-reducing medication and improvement in respiratory symptoms), whichever is longer Get plenty of rest and push fluids Tessalon Perles prescribed for cough Prednisone was prescribed Use medications daily for symptom relief Use OTC medications like ibuprofen or tylenol as needed fever or pain Call or go to the ED if you have any new or  worsening symptoms such as fever, worsening cough, shortness of breath, chest tightness, chest pain, turning blue, changes in mental status, etc...  Outlined signs and symptoms indicating need for more acute intervention. Patient verbalized understanding. After Visit Summary given.  Note: This document was prepared using Dragon voice recognition software and may include unintentional dictation errors.    Durward Parcel, FNP 03/04/20 1347

## 2020-03-04 NOTE — ED Triage Notes (Signed)
Pt presents with back pain, urinary frequency and cold symptoms for past couple of days

## 2020-03-04 NOTE — Discharge Instructions (Signed)
Urine culture sent.  We will call you with the results.   Push fluids and get plenty of rest.    COVID testing ordered.  It will take between 2-7 days for test results.  Someone will contact you regarding abnormal results.    In the meantime: You should remain isolated in your home for 10 days from symptom onset AND greater than 72 hours after symptoms resolution (absence of fever without the use of fever-reducing medication and improvement in respiratory symptoms), whichever is longer Get plenty of rest and push fluids Tessalon Perles prescribed for cough Prednisone was prescribed Use medications daily for symptom relief Use OTC medications like ibuprofen or tylenol as needed fever or pain Call or go to the ED if you have any new or worsening symptoms such as fever, worsening cough, shortness of breath, chest tightness, chest pain, turning blue, changes in mental status, etc..Marland Kitchen

## 2020-03-05 LAB — URINE CULTURE: Culture: NO GROWTH

## 2020-03-06 LAB — NOVEL CORONAVIRUS, NAA: SARS-CoV-2, NAA: NOT DETECTED

## 2020-03-06 LAB — SARS-COV-2, NAA 2 DAY TAT

## 2022-03-12 ENCOUNTER — Ambulatory Visit: Payer: No Typology Code available for payment source | Admitting: Urology

## 2022-06-02 ENCOUNTER — Ambulatory Visit
Admission: EM | Admit: 2022-06-02 | Discharge: 2022-06-02 | Disposition: A | Discharge status: Home / Self Care | Payer: No Typology Code available for payment source

## 2022-06-02 ENCOUNTER — Encounter: Payer: Self-pay | Admitting: Emergency Medicine

## 2022-06-02 DIAGNOSIS — L089 Local infection of the skin and subcutaneous tissue, unspecified: Secondary | ICD-10-CM | POA: Diagnosis not present

## 2022-06-02 DIAGNOSIS — L723 Sebaceous cyst: Secondary | ICD-10-CM

## 2022-06-02 HISTORY — DX: Type 2 diabetes mellitus without complications: E11.9

## 2022-06-02 HISTORY — DX: Essential (primary) hypertension: I10

## 2022-06-02 HISTORY — DX: Pure hypercholesterolemia, unspecified: E78.00

## 2022-06-02 MED ORDER — CHLORHEXIDINE GLUCONATE 4 % EX LIQD: Freq: Every day | CUTANEOUS | 0 refills | Status: AC | PRN

## 2022-06-02 MED ORDER — CEPHALEXIN 500 MG PO CAPS: 500.0000 mg | ORAL_CAPSULE | Freq: Two times a day (BID) | ORAL | 0 refills | Status: AC

## 2022-06-02 MED ORDER — MUPIROCIN 2 % EX OINT: 1.0000 | TOPICAL_OINTMENT | Freq: Two times a day (BID) | CUTANEOUS | 0 refills | Status: AC

## 2022-06-02 NOTE — ED Triage Notes (Signed)
Cyst on right shoulder blade x 3 months.  States has it lanced a year ago and now has returned.

## 2022-06-02 NOTE — ED Provider Notes (Addendum)
[facility]    CSN: [id number] Arrival date & time: 06/02/22  1042      History   Chief Complaint No chief complaint on file.   HPI Thomas Johnson is a 61 y.o. male.   Presenting today with with a painful cyst to the right upper back.  States been present for several years now, had it lanced/excised about a year ago but states it has come back and is worse than before.  The past few weeks states has become larger and more painful and yesterday had some small amounts of drainage from the area.  Not tried anything over-the-counter for symptoms.    Past Medical History:  Diagnosis Date   Diabetes mellitus without complication (HCC)    High cholesterol    Hypertension     There are no problems to display for this patient.   Past Surgical History:  Procedure Laterality Date   COLONOSCOPY N/A 11/04/2012   Procedure: COLONOSCOPY;  Surgeon: Daneil Dolin, MD;  Location: [facility];  Service: Endoscopy;  Laterality: N/A;  9:30 AM   SHOULDER ARTHROTOMY     removal of cyst       Home Medications    Prior to Admission medications   Medication Sig Start Date End Date Taking? Authorizing Provider  atorvastatin (LIPITOR) 80 MG tablet Take 80 mg by mouth daily.   Yes [provider]  cephALEXin (KEFLEX) 500 MG capsule Take 1 capsule (500 mg total) by mouth 2 (two) times daily. 06/02/22  Yes Volney American, PA-C  chlorhexidine (HIBICLENS) 4 % external liquid Apply topically daily as needed. 06/02/22  Yes Volney American, PA-C  empagliflozin (JARDIANCE) 25 MG TABS tablet Take by mouth daily.   Yes [provider]  furosemide (LASIX) 20 MG tablet Take 20 mg by mouth.   Yes [provider]  glipiZIDE (GLUCOTROL) 10 MG tablet Take 10 mg by mouth daily before breakfast.   Yes [provider]  losartan (COZAAR) 100 MG tablet Take 100 mg by mouth daily.   Yes [provider]  mupirocin ointment (BACTROBAN) 2 %  Apply 1 Application topically 2 (two) times daily. 06/02/22  Yes Volney American, PA-C  benzonatate (TESSALON) 100 MG capsule Take 1 capsule (100 mg total) by mouth every 8 (eight) hours. 03/04/20   Avegno, Darrelyn Hillock, FNP  cyclobenzaprine (FLEXERIL) 10 MG tablet Take 1 tablet (10 mg total) by mouth 3 (three) times daily as needed. Patient not taking: Reported on 07/28/2014 01/31/13   Kem Parkinson, PA-C  doxycycline (VIBRAMYCIN) 100 MG capsule Take 1 capsule (100 mg total) by mouth 2 (two) times daily. 07/28/14   Rancour, Annie Main, MD  HYDROcodone-acetaminophen (NORCO/VICODIN) 5-325 MG per tablet Take one-two tabs po q 4-6 hrs prn pain Patient not taking: Reported on 07/28/2014 01/31/13   Triplett, Tammy, PA-C  naproxen (NAPROSYN) 500 MG tablet Take 1 tablet (500 mg total) by mouth 2 (two) times daily with a meal. Patient not taking: Reported on 07/28/2014 01/31/13   Kem Parkinson, PA-C  omeprazole (PRILOSEC) 20 MG capsule Take 1 po BID x 2 weeks then once a day 03/03/16   Rolland Porter, MD  ondansetron (ZOFRAN ODT) 4 MG disintegrating tablet Take 1 tablet (4 mg total) by mouth every 8 (eight) hours as needed. 07/29/14   Fredia Sorrow, MD  ondansetron (ZOFRAN) 4 MG tablet Take 1 tablet (4 mg total) by mouth every 8 (eight) hours as needed. 03/03/16   Rolland Porter, MD  predniSONE (DELTASONE) 10 MG tablet Take 2 tablets (20 mg total) by mouth daily. 03/04/20   Avegno, Darrelyn Hillock, FNP  promethazine (PHENERGAN) 25 MG tablet Take 1 tablet (25 mg total) by mouth every 6 (six) hours as needed. 07/29/14   Fredia Sorrow, MD    Family History Family History  Family history unknown: Yes    Social History Social History   Tobacco Use   Smoking status: Never   Smokeless tobacco: Never  Substance Use Topics   Alcohol use: Yes    Comment: rarely   Drug use: No     Allergies   Patient has no known allergies.   Review of Systems Review of Systems Per HPI  Physical Exam Triage Vital Signs ED  Triage Vitals [06/02/22 1055]  Enc Vitals Group     BP (!) 153/81     Pulse Rate 73     Resp 18     Temp 98.5 F (36.9 C)     Temp Source Oral     SpO2 95 %     Weight      Height      Head Circumference      Peak Flow      Pain Score 4     Pain Loc      Pain Edu?      Excl. in [place]?    No data found.  Updated Vital Signs BP (!) 153/81 (BP Location: Right Arm)   Pulse 73   Temp 98.5 F (36.9 C) (Oral)   Resp 18   SpO2 95%   Visual Acuity Right Eye Distance:   Left Eye Distance:   Bilateral Distance:    Right Eye Near:   Left Eye Near:    Bilateral Near:     Physical Exam Vitals and nursing note reviewed.  Constitutional:      Appearance: Normal appearance.  HENT:     Head: Atraumatic.  Eyes:     Extraocular Movements: Extraocular movements intact.     Conjunctiva/sclera: Conjunctivae normal.  Cardiovascular:     Rate and Rhythm: Normal rate and regular rhythm.  Pulmonary:     Effort: Pulmonary effort is normal.     Breath sounds: Normal breath sounds.  Musculoskeletal:        General: Normal range of motion.     Cervical back: Normal range of motion and neck supple.  Skin:    General: Skin is warm and dry.     Comments: 2.5 cm firm area of tenderness and edema left upper back, multiple large blackheads to the center of the area  Neurological:     General: No focal deficit present.     Mental Status: He is oriented to person, place, and time.  Psychiatric:        Mood and Affect: Mood normal.        Thought Content: Thought content normal.        Judgment: Judgment normal.      UC Treatments / Results  Labs (all labs ordered are listed, but only abnormal results are displayed) Labs Reviewed - No data to display  EKG   Radiology No results found.  Procedures Incision and Drainage  Date/Time: 06/02/2022 12:18 PM  Performed by: Volney American, PA-C Authorized by: Volney American, PA-C   Consent:    Consent obtained:   Verbal   Consent given by:  Patient   Risks, benefits, and alternatives were discussed: yes     Risks  discussed:  Bleeding   Alternatives discussed:  Alternative treatment Universal protocol:    Procedure explained and questions answered to patient or proxy's satisfaction: yes     Relevant documents present and verified: yes     Patient identity confirmed:  Verbally with patient and arm band Location:    Type:  Cyst   Location: Right upper back. Pre-procedure details:    Skin preparation:  Chlorhexidine with alcohol Sedation:    Sedation type:  None Anesthesia:    Anesthesia method:  Local infiltration   Local anesthetic:  Lidocaine 2% WITH epi Procedure type:    Complexity:  Simple Procedure details:    Ultrasound guidance: no     Incision types:  Stab incision   Incision depth:  Dermal   Wound management:  Probed and deloculated   Drainage:  Bloody   Drainage amount:  Scant   Wound treatment:  Wound left open Post-procedure details:    Procedure completion:  Tolerated well, no immediate complications  (including critical care time)  Medications Ordered in UC Medications - No data to display  Initial Impression / Assessment and Plan / UC Course  I have reviewed the triage vital signs and the nursing notes.  Pertinent labs & imaging results that were available during my care of the patient were reviewed by me and considered in my medical decision making (see chart for details).     Vital signs reassuring today, despite this already having some spontaneous drainage requesting I&D today.  This was performed with no significant drainage, treat with Keflex, Hibiclens, mupirocin, warm compresses and follow-up with PCP next week for recheck and consideration of full excision procedure.  Final Clinical Impressions(s) / UC Diagnoses   Final diagnoses:  Infected sebaceous cyst     Discharge Instructions      Apply warm compresses to the area on and off as needed,  cleaning at least once daily with Hibiclens and apply mupirocin ointment and a nonstick dressing.  Take the full course of Keflex and follow-up with your primary care provider within the next week to discuss a full excision of the area if needed    ED Prescriptions     Medication Sig Dispense Auth. Provider   cephALEXin (KEFLEX) 500 MG capsule Take 1 capsule (500 mg total) by mouth 2 (two) times daily. 14 capsule Particia Nearing, [place]   chlorhexidine (HIBICLENS) 4 % external liquid Apply topically daily as needed. 120 mL Particia Nearing, PA-C   mupirocin ointment (BACTROBAN) 2 % Apply 1 Application topically 2 (two) times daily. 22 g Particia Nearing, [place]      PDMP not reviewed this encounter.   Particia Nearing, PA-C 06/02/22 1217    Particia Nearing, [place] 06/02/22 1219

## 2022-06-02 NOTE — Discharge Instructions (Signed)
Apply warm compresses to the area on and off as needed, cleaning at least once daily with Hibiclens and apply mupirocin ointment and a nonstick dressing.  Take the full course of Keflex and follow-up with your primary care provider within the next week to discuss a full excision of the area if needed

## 2022-09-22 ENCOUNTER — Encounter: Payer: Self-pay | Admitting: *Deleted

## 2024-02-22 ENCOUNTER — Encounter: Payer: Self-pay | Admitting: Family Medicine

## 2024-02-23 ENCOUNTER — Other Ambulatory Visit (HOSPITAL_COMMUNITY): Payer: Self-pay | Admitting: Family Medicine

## 2024-02-23 DIAGNOSIS — R1310 Dysphagia, unspecified: Secondary | ICD-10-CM

## 2024-03-06 ENCOUNTER — Ambulatory Visit (HOSPITAL_COMMUNITY)
Admission: RE | Admit: 2024-03-06 | Discharge: 2024-03-06 | Disposition: A | Discharge status: Home / Self Care | Source: Ambulatory Visit | Attending: Family Medicine | Admitting: Family Medicine

## 2024-03-06 DIAGNOSIS — R1310 Dysphagia, unspecified: Secondary | ICD-10-CM | POA: Insufficient documentation
